# Patient Record
Sex: Female | Born: 1955 | Race: White | Hispanic: No | Smoking: Never smoker
Health system: Southern US, Community
[De-identification: ages and names within clinical notes are randomized; demographics above are authoritative.]

## PROBLEM LIST (undated history)

## (undated) DIAGNOSIS — R569 Unspecified convulsions: Secondary | ICD-10-CM

## (undated) DIAGNOSIS — I639 Cerebral infarction, unspecified: Secondary | ICD-10-CM

## (undated) DIAGNOSIS — K5790 Diverticulosis of intestine, part unspecified, without perforation or abscess without bleeding: Secondary | ICD-10-CM

## (undated) DIAGNOSIS — G4730 Sleep apnea, unspecified: Secondary | ICD-10-CM

## (undated) DIAGNOSIS — I10 Essential (primary) hypertension: Secondary | ICD-10-CM

## (undated) DIAGNOSIS — G459 Transient cerebral ischemic attack, unspecified: Secondary | ICD-10-CM

## (undated) DIAGNOSIS — K219 Gastro-esophageal reflux disease without esophagitis: Secondary | ICD-10-CM

## (undated) DIAGNOSIS — E039 Hypothyroidism, unspecified: Secondary | ICD-10-CM

## (undated) DIAGNOSIS — E079 Disorder of thyroid, unspecified: Secondary | ICD-10-CM

## (undated) DIAGNOSIS — C4490 Unspecified malignant neoplasm of skin, unspecified: Secondary | ICD-10-CM

## (undated) DIAGNOSIS — F32A Depression, unspecified: Secondary | ICD-10-CM

## (undated) DIAGNOSIS — K760 Fatty (change of) liver, not elsewhere classified: Secondary | ICD-10-CM

## (undated) DIAGNOSIS — F329 Major depressive disorder, single episode, unspecified: Secondary | ICD-10-CM

## (undated) DIAGNOSIS — L659 Nonscarring hair loss, unspecified: Secondary | ICD-10-CM

## (undated) DIAGNOSIS — K589 Irritable bowel syndrome without diarrhea: Secondary | ICD-10-CM

## (undated) DIAGNOSIS — M1990 Unspecified osteoarthritis, unspecified site: Secondary | ICD-10-CM

## (undated) DIAGNOSIS — E119 Type 2 diabetes mellitus without complications: Secondary | ICD-10-CM

## (undated) DIAGNOSIS — K5792 Diverticulitis of intestine, part unspecified, without perforation or abscess without bleeding: Secondary | ICD-10-CM

## (undated) DIAGNOSIS — E785 Hyperlipidemia, unspecified: Secondary | ICD-10-CM

## (undated) DIAGNOSIS — F410 Panic disorder [episodic paroxysmal anxiety] without agoraphobia: Secondary | ICD-10-CM

## (undated) DIAGNOSIS — F419 Anxiety disorder, unspecified: Secondary | ICD-10-CM

## (undated) HISTORY — DX: Unspecified osteoarthritis, unspecified site: M19.90

## (undated) HISTORY — DX: Hyperlipidemia, unspecified: E78.5

## (undated) HISTORY — DX: Depression, unspecified: F32.A

## (undated) HISTORY — DX: Hypothyroidism, unspecified: E03.9

## (undated) HISTORY — DX: Sleep apnea, unspecified: G47.30

## (undated) HISTORY — DX: Unspecified malignant neoplasm of skin, unspecified: C44.90

## (undated) HISTORY — DX: Major depressive disorder, single episode, unspecified: F32.9

## (undated) HISTORY — DX: Gastro-esophageal reflux disease without esophagitis: K21.9

## (undated) HISTORY — DX: Anxiety disorder, unspecified: F41.9

## (undated) HISTORY — PX: ABDOMINAL HYSTERECTOMY: SHX81

---

## 1984-11-13 HISTORY — PX: ABDOMINAL HYSTERECTOMY: SUR658

## 2007-11-14 DIAGNOSIS — R569 Unspecified convulsions: Secondary | ICD-10-CM

## 2007-11-14 HISTORY — PX: CHOLECYSTECTOMY: SHX55

## 2007-11-14 HISTORY — DX: Unspecified convulsions: R56.9

## 2009-06-25 ENCOUNTER — Ambulatory Visit: Payer: Self-pay

## 2009-12-31 ENCOUNTER — Inpatient Hospital Stay: Payer: Self-pay | Admitting: Psychiatry

## 2010-11-13 DIAGNOSIS — L659 Nonscarring hair loss, unspecified: Secondary | ICD-10-CM

## 2010-11-13 HISTORY — DX: Nonscarring hair loss, unspecified: L65.9

## 2012-11-13 HISTORY — PX: SQUAMOUS CELL CARCINOMA EXCISION: SHX2433

## 2015-04-21 ENCOUNTER — Encounter: Payer: Self-pay | Admitting: Licensed Clinical Social Worker
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
## 2015-04-21 NOTE — Progress Notes (Signed)
### [facility] LCSW Progress Note:   Suzana Sohail 12/14/2014 2:14 PM Location: [facility] Patient #: [id number] DOB: 06-14-1956 Widowed / Language: Cleophus Molt / Race: White Female    History of Present Illness(Herchel Hopkin N Joshalyn Ancheta; 12/15/2014 11:52 AM) The patient is a 59 year old female who presents for a recheck of Depressive disorders. It is classified as major depression. Symptoms include depressed mood, fatigue and poor concentration, while symptoms do not include loss of interest, crying spells, hypersomnia, headaches or irritability. Onset followed emotional stress, financial problems, family problems and bereavement issues. The patient describes this as moderate in severity and improving. Symptoms are exacerbated by emotional stress and family stressors. Symptoms are relieved by antidepressants, psychotherapy and quiet environment. Associated symptoms include anxiety symptoms and panic symptoms. Suicide risk factors do not include suicidal thoughts, suicidal intention, suicide attempt(s), organized plans or lethal means available.  Additional reason for visit:  Recheck of Anxiety disordersis described as the following: It is classified as generalized anxiety disorder and post traumatic stress disorder. Symptoms include palpitations, trouble breathing, trembling, racing thoughts, excessive worrying, flashbacks and sleep disturbance (Informed LCSW that "I just don't want to get out of the bed. All I want to do is to sleep."), while symptoms do not include fear of dying, fear of losing control or repetitive behaviors. Onset followed family problems and bereavement issues. The patient describes this as moderate in severity and improving. Symptoms are exacerbated by uncomfortable situations, emotional stress and family stress. Symptoms are relieved by anxiolytics, antidepressants, psychotherapy, quiet environment and rest. Associated symptoms include poor  concentration and depression symptoms. Suicide risk factors do not include suicidal thoughts, suicidal intention or suicide attempt(s).  Note: Start Time: 2:14 p.m. End Time: 3:20 p.m.  Aniah returns to [facility] to continue addressing depression and anxiety which was further exacerbated over the past two years with accidental shooting death of her grandson. Client also continues to see Dr. Nicolasa Ducking for medication management. Overall Zaydah's moods have improved and she has experienced a reduction in many of her emotional/behavioral symptoms. Ongoing stressor is "Mother is getting worse with her memory and is falling a lot." Step-father will look into any assistance via [place] since he is a veteran since the two of them have financial assets that disqualify them for any state or local services/programs for in-home care.  "We went to [place] again. We did more. We saw more and went to [facility]." This was over Christmas and to son in [place] family and she did enjoy her time. "Another thing is I retired after 35 years." "I'm having better days than worse days." "I don't have to worry about money and I'm setting some back. My brother is living with me. We get along pretty good." He is going through a separation and is on dialysis. The two of them have joined a gym and although they have not been she will go with him today. Lyrik sees the two as being there to encourage one another and motivate each other to make some lifestyle changes.  Colvin Caroli, is moving in with the father since client's daughter and her husband are separating. "He's moving out now." Her daughter Benjamine Mola is having ups and downs. She recently visited daughter and grand-children and so far their separtion is amicable yet she has some concern about how things will go since the daughter does not work. Son-in-law will finish his [place] in about three years and planning to move back to [place]. "I told her that  I'll be here for her." Ravenna is pleased that daughter has good friends and support network. Recent visit was really a mother-daughter bonding experience and Yvonna shared a few interactions that the two had including daughter giving client one of the deceased grand-son's favorite toys.  Processed ongoing grief reactions and Faven stated that her moments of over-whelming grief are fewer and fewer. "Some days I still cry. I feel like I'm so much stronger than I was. I feel like I use to. I don't feel 100% like I use to be but I'm getting better. I'm just so glad that I could retire and get my pension."  She has reconnected with a friend who's husband recently died. Azaliah has enjoyed giving friends/family members gifts over the [place]. "I'm working on getting my diabetes under control."  She and LCSW reviewed treatment plan and client felt that the goals were still the same and she was able to recognize the progress that she has made as well as the areas still to work on improvement. Day signed treatment plan and since she is doing much better the two of [place] agreed for a return appointment in April 2016.   Supportive therapy with insight was provided and encouraged client to schedule additional appointments with LCSW PRN. No additional concerns or needs assessed or voiced during session today.    Social History(Sonni Barse N Keysi Oelkers; 12/14/2014 2:55 PM) Marital status. Widowed 01/14/09: Kerry Dory is her third husband 10/10, lived together for five and married nearly 71; 1st married in 1976, had daughter in 40, second husband was an alcoholic; No Drug Use. Has a history of cocaine and cannabis abuse. Full remission. Employment. 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 She has been on disability through employer since May 2009: Since 1979 was with [employer], now [employer] for years. Living Situation. Lives with relatives. Younger  brother who is ill and on dialysis just moved in with client about two months ago. Drug Use Number of children. Daughter is Benjamine Mola, now 104; step-daughter & step-son were ages 4 & 70 when she married husband who was widowed but he was a single father Additional comments. has two older brothers & one younger brother; daughter had a son in 2009, Memorial Day; 67 year old grand-daughter, 25 year old grand-son & 7 mos. old twins, boy & girl, all daughters & in [place] Prior heavy alcohol use (>= 4 drinks/day). Occasional alcohol use. History of alcoholism per client and previous psychiatric assessment. Tobacco use. Current every day smoker.    Medication History(Laurian Edrington N Jaiden Wahab; 12/14/2014 2:22 PM) ClonazePAM (0.5MG  Tablet, 1 Oral as needed) Active. TraZODone HCl (100MG  Tablet, 2 Oral QHS) Active. (Added a few months ago by Dr. Nicolasa Ducking.) HydrOXYzine HCl (50MG  Tablet, 1 tab Oral TID PRN) Active. (Prescribed by client's Psychiatrist, Dr. Cherene Altes for the treatment of anxiety.) Zoloft (100MG  Tablet, 2 tabs Oral QD) Active. (Prescribed by client's Psychiatrist, Dr. Cherene Altes.) Abilify (5MG  Tablet, 1 Oral QD) Active. (Prescribed by client's Psychiatrist, Dr. Cherene Altes.)    Review of Systems(Shalimar Mcclain N Maysie Parkhill; 12/14/2014 3:05 PM) Psychiatric:Present- Anxiety ("I'm fine."), Decrease concentration (Cannot read books even at this point since she is having such a difficult time concentrating.), Depression ("I'm trying to stay busy." "I'm not as depressed."), Hypersomnia (Napping during the day when she does not sleep well at night.) and Trouble Falling Asleep (Episodically experiences this. Has sleep aides available and also tries watching television until falls asleep.  LCSW discussed sleep hygiene and benefits of this.). Not Present- Change in Sleep Pattern ("I'm not having bad dreams anymore." Still on occasion will have odd dreams about work related issues. Some improvement noted with 200 mg  Trazadone. Uses 1/2 mg Klonopin at night time to slow racing thoughts.), Frequent crying, Inability to Concentrate, Nervousness ("I've improved a lot."), Panic Attacks (These are better yet still has bouts of restlessness but taking another anti-anxiety medication added by Dr. Nicolasa Ducking. Overall reports a decrease in number of panic attacks. Could not recall last but quesses about twice per month.), Suicidal Ideation and Suicidal Planning.    Assessment & Plan(Niklaus Mamaril N Erla Bacchi; 12/15/2014 11:00 AM) MDD (major depressive disorder), recurrent episode, moderate (296.32  F33.1) Current Plans l Short Term Goal: Patient will Develop Appropriate Coping Skills  l Interventions: 1. Encourage expression of feelings, concerns and needs and offered psychoeducation/therapy with insight around recognizing ways of thinking, interpreting and unhealthy patterns of behaviors. 2. Explored alternative responses for dealing effectively with stress-inducing situations and discussed cognitive reframing using client's own examples. 3. Encouraged client to focus on personal progress, strengths and accomplishments. 4. Reframed failures or negative experiences as normal part of the learning process and human condition. 5. Assisted client to identify their own healthy goals and useful skill set and increase awareness of negative self talk. 6. Supportive psychotherapy with insight, highlighted Madelon's emotional growth and personal awareness and use of healthier coping skills.   - C - POSTTRAUMATIC STRESS DIS (309.81  F43.10) Impression: Appears stable and controlled with medication and self care. This was not a topic of today's session. Current Plans l Short Term Goal: Identify Triggers for Symptoms  l Interventions: 1. Explore cognitive messages that mediate anxiety response and retrain in adaptive cognitions. Encouraged to find ways to use previously provided handout on increasing positive self  talk. 2. Reinforce insights into past emotional issues and present anxiety. (i.e., trauma and trust issues, fears). 3. Highligted and reinforced client's development of more reality-based cognitive messages that appear to increase self-confidence in coping with irrational fears and intense negative emotions. 4. Educate to importance of developing assertive communication skills to be able to say "no" and avoid opportunities to over-extend self. 5. Congratulated client on practicing self-nurturing behaviors such as getting a pedicure. 6. Reinforced the value of surrounding self with positive people and activities, having more daily structure and things to look forward to doing and emphasized use of previous handouts on stress management and "letting go."   Bereavement care (V62.82  Z63.4) Current Plans l Short Term Goal: Patient will be Able to Communicate Needs or Concerns  l Interventions: 1. Continue to demonstrate care and empathy and validated her bereavement experience. 2. Ongoing education on stages of grief, and normalize appropriate feelings such as anger, guilt, depression, longing for and development of spiritual conflict. 3. Encourage and normalize all feelings expressed and explore availabe supports and resources for her as she continues to grieve and as it comes upon anniversaries of grandson's death, holidays, his birthday, etc. 4. Process the impact of multiple losses on both grief, coping, life perspectives and depressive symptoms.  l Level of Participation: Interactive  l Patient Strength: Verbal  l Patient Strength: Managing Daily Responsibilities  l Patient Strength: Cultural/Spiritual and Community Support/Involvement  l Patient Strength: Family involvement or Support  l Patient Strength: Stable Housing  l Patient Strength: Aware of Need for Medications  l INDIVIDUAL PSYCHOTHERAPY FOR 45 TO 50 MINUTES  (96222) l Follow up as needed  Signed electronically by Marian Sorrow Jowana Thumma (12/15/2014 11:52 AM)

## 2015-04-21 NOTE — Progress Notes (Signed)
### ALLSCRIPTS PRO LCSW Progress Note:   Taylor Thompson 09/30/2014 11:22 AM Location: [facility] Patient #: [id number] DOB: October 23, 1956 Widowed / Language: Taylor Thompson / Race: White Female    History of Present Illness(Taylor Thompson N Taylor Thompson; 09/30/2014 1:07 PM) The patient is a 59 year old female who presents for a recheck of Anxiety disorders. It is classified as generalized anxiety disorder and post traumatic stress disorder. Symptoms include palpitations, trouble breathing, trembling, racing thoughts, excessive worrying, flashbacks and sleep disturbance (Informed LCSW that "I just don't want to get out of the bed. All I want to do is to sleep."), while symptoms do not include fear of dying, fear of losing control or repetitive behaviors. Onset followed family problems and bereavement issues. The patient describes this as moderate in severity and improving. Symptoms are exacerbated by uncomfortable situations, emotional stress and family stress. Symptoms are relieved by anxiolytics, antidepressants, psychotherapy, quiet environment and rest. Associated symptoms include poor concentration and depression symptoms. Suicide risk factors do not include suicidal thoughts, suicidal intention or suicide attempt(s).  Additional reasons for visit:  Recheck of Depressive disordersis described as the following: It is classified as major depression. Symptoms include depressed mood, fatigue and poor concentration, while symptoms do not include loss of interest, crying spells, hypersomnia, headaches or irritability. Onset followed emotional stress, financial problems, family problems and bereavement issues. The patient describes this as moderate in severity and improving. Symptoms are exacerbated by emotional stress and family stressors. Symptoms are relieved by antidepressants, psychotherapy and quiet environment. Associated symptoms include anxiety symptoms and panic symptoms.  Suicide risk factors do not include suicidal thoughts, suicidal intention, suicide attempt(s), organized plans or lethal means available.  Recheck of Adjustment disordersis described as the following: This adjustment disorder is acute. Symptoms include depressed mood, palpitations, anxiety and jitteriness, while symptoms do not include tearfulness or agitated mood. The patient describes this as moderate in severity and improving. Symptoms are exacerbated by emotional stress and fatigue. Symptoms are relieved by psychotherapy, support groups and talking with friends. Note for "Adjustment disorders": 09/28/14, made the second anniversary of grandson's death. Grief reaction is stabilizing.  Note: Taylor Thompson returns to session to continue addressing symptoms of anxiety and depression which she experiences as feeling easily over-whelmed, disinterested in doing things, easily frustrated and maintains various distorted thoughts which appear to perpetuate her down moods and anxiety. "I've always been sensitive to criticism." Feeling somewhat overwhelmed about what to do with lump sum money from her former employer yet she has a good plan of how to handle the money to make it available for her for years to come. Anxiety around how much taxes she fears she will have to pay on the money and LCSW suggested she speak with financial advisor and/or account before jumping to conclusions and keeping self anxious about this. Taylor Thompson voiced agreement.  Recent concern because daughter had two miscarriages and then had serious medical complications afterwards. Client spent time with daughter, son in law and three grand-children recently following daughter's miscarriage. She was a help around the house to family and felt proud of what she helped with yet voiced sadness over feeling that daughter was critical of her. We discussed the need to stay aware of family dynamics, boundaries and how she internalizes what others think and say  and to be cautious as to not criticize herself and fall into self doubt.  Processed overall her progress in terms of moods, symptoms and behaviors. "I struggle every day but I've come a  long way."  LCSW reinforced client's positive character traits and successes through life and management of day to day tasks. She continues to find the therapeutic experience as helpful citing "I'm not suicidal, that does not enter my mind anymore. I don't feel that way anymore." Her primary goal is to continue to have someone to talk with and build upon present coping skills and interventions while also learning more about how to handle this internal critic and gain a healthier view of herself and her self worth.  Overall, client is not at current risk of harm to others or self. Reinforced importance of self help around understanding depressive symptoms and impact of these on inter & intrapersonal interactions and conflicts.    Problem List/Past Medical(Taylor Thompson N Taylor Thompson; 09/30/2014 11:28 AM) MDD (major depressive disorder), recurrent episode, moderate (296.32  F33.1) Bereavement care (V62.82  Taylor Thompson)    Social History(Taylor Thompson N Taylor Thompson; 09/30/2014 11:35 AM) Employment. 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 She has been on disability through employer since May 2009: Since 1979 was with [employer], now [employer] for years. Living Situation. Lives with relatives. Younger brother who is ill and on dialysis just moved in with client about two months ago.    Medication History(Taylor Thompson N Taylor Thompson; 09/30/2014 12:10 PM) TraZODone HCl (100MG  Tablet, 2 Oral QHS) Active. (Added a few months ago by Dr. Nicolasa Ducking.) ClonazePAM (0.5MG  Tablet, 1 Oral BID) Active. (Recent decrease in this medication. Will talk to Dr. Nicolasa Ducking as she is not satisfied with this decision.) HydrOXYzine HCl (50MG  Tablet, 1 tab Oral TID PRN) Active. (Prescribed by client's  Psychiatrist, Dr. Cherene Altes for the treatment of anxiety.) Zoloft (100MG  Tablet, 2 tabs Oral QD) Active. (Prescribed by client's Psychiatrist, Dr. Cherene Altes.) Abilify (5MG  Tablet, 1 Oral QD) Active. (Prescribed by client's Psychiatrist, Dr. Cherene Altes.)    Review of Systems(Jarell Mcewen N Lyonel Morejon; 09/30/2014 12:15 PM) Psychiatric:Present- Anxiety ("Yesterday I had a really bad day." Reports an increase in anxiety.), Decrease concentration (Cannot read books even at this point since she is having such a difficult time concentrating.), Depression (Continues to feel and report improvement. "I have my bad days." Denied that these were as frequent as they have been.), Hypersomnia (Napping during the day when she does not sleep well at night.) and Trouble Falling Asleep (Episodically experiences this. Has sleep aides available and also tries watching television until falls asleep. LCSW discussed sleep hygiene and benefits of this.). Not Present- Change in Sleep Pattern (Some improvement noted with 200 mg Trazadone. Uses 1/2 mg Klonopin at night time to slow racing thoughts.), Frequent crying, Inability to Concentrate, Nervousness ("I've improved a lot."), Panic Attacks (These are better yet she is still having panicky symptoms.), Suicidal Ideation and Suicidal Planning.    Assessment & Plan(Madi Bonfiglio N Jedrek Dinovo; 09/30/2014 1:05 PM) Major depressive disorder, recurrent episode, moderate (296.32  F33.1) Current Plans l Short Term Goal: Patient will Develop Appropriate Coping Skills  l Interventions: 1. Encourage expression of feelings, concerns and needs and offered psychoeducation/therapy with insight around recognizing ways of thinking, interpreting and unhealthy patterns of behaviors. 2. Explored alternative responses for dealing effectively with stress-inducing situations and discussed cognitive reframing using client's own examples. 3. Encouraged client to focus on personal progress,  strengths and accomplishments. 4. Reframed failures or negative experiences as normal part of the learning process and human condition. 5. Assisted client to identify their own healthy goals and useful skill set and increase awareness of  negative self talk. 6. Supportive psychotherapy with insight, highlighted Sherriann's emotional growth and personal awareness and use of healthier coping skills.   Bereavement care (V62.82  Z63.4) Current Plans l Short Term Goal: Patient will be Able to Communicate Needs or Concerns  l Interventions: 1. Continue to demonstrate care and empathy and validated her bereavement experience. 2. Ongoing education on stages of grief, and normalize appropriate feelings such as anger, guilt, depression, longing for and development of spiritual conflict. 3. Encourage and normalize all feelings expressed and explore availabe supports and resources for her as she continues to grieve and as it comes upon anniversaries of grandson's death, holidays, his birthday, etc. 4. Process the impact of multiple losses on both grief, coping, life perspectives and depressive symptoms.  l Level of Participation: Interactive  l Patient Strength: Verbal  l Patient Strength: Managing Daily Responsibilities  l Patient Strength: Cultural/Spiritual and Community Support/Involvement  l Patient Strength: Family involvement or Support  l Patient Strength: Stable Housing  l Patient Strength: Aware of Need for Medications  l Follow up as needed  l INDIVIDUAL PSYCHOTHERAPY FOR 45 TO 50 MINUTES ([id number])   Signed electronically by Marian Sorrow Bakari Nikolai (09/30/2014 1:08 PM)

## 2015-04-21 NOTE — Progress Notes (Signed)
### ALLSCRIPTS PRO LCSW Progress Note:    Taylor Thompson 08/11/2013 1:24 PM Location: [facility] Patient #: [id number] DOB: [birth year] Widowed / Language: Cleophus Molt / Race: White Female    History of Present Illness(Lurline Caver N Lateia Fraser; 08/18/2013 10:02 AM) The patient is a 59 year old female who presents with a depressive disorder. It is classified as major depression. Symptoms include loss of interest, depressed mood, crying spells, fatigue, poor concentration, hypersomnia, headaches and irritability. Onset followed emotional stress, financial problems, family problems and bereavement issues. The patient describes this as moderate in severity and improving. Symptoms are exacerbated by emotional stress and family stressors. Symptoms are relieved by antidepressants, psychotherapy and quiet environment. Associated symptoms include anxiety symptoms and panic symptoms. Suicide risk factors do not include suicidal thoughts, suicidal intention, suicide attempt(s), organized plans or lethal means available. Note for "Depressive disorders": "I haven't felt well for the past few weeks." Client has had trouble with her blood sugars which have been in the 500s & an A1C was at 13, very high.  "I'm good. The only thing that isn't good is my blood sugar. It's been running high." This has been worse over last two weeks. Her daughter & twin grand-daughter/grand-son have been visting with her and she is enjoying their stay. Although she still has days where she does not feel as motivated to do things she is keeping up with ADLs and household chores and usually feels better after completing these. She recently enjoyed time alone getting a pedicure.  Additional reasons for visit:  Anxiety disordersis described as the following: It is classified as post traumatic stress disorder. Symptoms include palpitations, trouble breathing, trembling, racing thoughts, excessive worrying,  flashbacks and sleep disturbance (Informed LCSW that "I just don't want to get out of the bed. All I want to do is to sleep."), while symptoms do not include fear of dying, fear of losing control or repetitive behaviors. Onset followed family problems and bereavement issues. The patient describes this as moderate in severity and improving. Symptoms are exacerbated by uncomfortable situations, emotional stress and family stress. Symptoms are relieved by anxiolytics, antidepressants, psychotherapy, quiet environment and rest. Associated symptoms include poor concentration and depression symptoms. Suicide risk factors do not include suicidal thoughts, suicidal intention or suicide attempt(s). Note for "Anxiety disorders": "It's getting better." Still worries often especially about her mother & stepfather and their age & increasing health problems & needs. Joyceline has some anxiety about own health problems and is motivated to continue to use coping strategies to manage stress as this has a direct impact on her DM. She has done a bit more exercise when weather permits and finds relief in the breathing techniques.  Adjustment disordersis described as the following: This adjustment disorder is acute. Symptoms include depressed mood, tearfulness, palpitations, anxiety, agitated mood and jitteriness. The patient describes this as moderate in severity and improving. Symptoms are exacerbated by emotional stress and fatigue. Symptoms are relieved by psychotherapy, support groups and talking with friends. Note for "Adjustment disorders": Harli has had a few good days while other days she talked about how she just "falls apart" crying, isolating and not wanting to get out of her bed. She informed LCSW "I'm mad. I feel like my son-in-law wasn't thinking straight." "I'm mad." She has found some comfort in reading books on grief, daily meditations and has plans to attend another grief support group later in the year,  especially the meeting on death of a child because she was unable  to go to that meeting.  LCSW explored with Meosha those thoughts and feelings that although are normal and to be expected as part of the grieving process, when left unresolved or not discussed, has the potential to complicate mood and quality of life. Reassured that anger is a normal stage of the grieving process and we discussed several reasons for this and the role of cognitive reframing when one feels stuck on a specific thought/thoughts and cannot move forward with their healing.  Urged ongoing venting of all feelings associated both with grandson's death & her grief. Mickey was appreciative and voiced understanding of the role of anger in healing grief.  Note: "I'm doing better." Mother has been hospitalized & admitted to nursing home again but now at home. Mother's mood flares up more and a brother, 47, is now in a nursing home due to Pneumonia and ESRD. Revive is part of [facility] & also with Grief Sharing are classes that she has attended more regularly held at a Church in [place].  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 She will have surgery but not until 10/16/13, with a delay because of this doctor's popularity. Mohs surgery is what she is having until the margins are cleared.  She & daughter are getting along well and both talk some about death of Killian's, Leonilda's grandson. She feels less resentment towards son in law since it was his gun that grandson had the accident with. Sherrice is remembering grand-son more and has pictures out & other memorabile to keep her grandson close to her.  Feeling less worried about family, mother's failing health and although she has anxiety, she is feeling less like a "worry wart" then she has in the past. Annalucia is still "having difficulty handling crowds" and is able to recognize that sometimes anxiety is due to skipping  medication. Overall, there is less dread and worry per Judson Roch.  Had a nice weekend in the [place] recently with her cousin and will return again in a few weeks. Feeling better about saying no to daughter & other family that ask for help. More focus on herself and shared having an "Ah Lamonte Sakai" moment in terms of accounting to others or being responsible to others and stating that "I have control. I don't have to answer to others."    Social History(Chere Babson N Arlina Sabina; 08/18/2013 10:03 AM) Living Situation. Lives alone.    Review of Systems(Naima Veldhuizen N Cate Oravec; 08/11/2013 2:23 PM) Psychiatric:Present- Anxiety, Change in Sleep Pattern (Somewhat better with regular use of CPAP machine and taking Klonopin. No longer having regular nightmares around loss of her marriage, her house and her job.), Depression (Better and feels happier. Doing more, getting out of the house.), Nervousness and Trouble Falling Asleep (Episodically experiences this. Has sleep aides available and also tries watching television until falls asleep. LCSW discussed sleep hygiene and benefits of this.). Not Present- Frequent crying (Reported today that she is having fewer crying spells. These seem more associated with grief.), Hypersomnia (She still endorses taking naps some days and we were able to talk about other contributing factors such as her not using her CPAP machine at night and lack of a bed time ritual/routine.), Inability to Concentrate, Panic Attacks, Suicidal Ideation and Suicidal Planning.    Assessment & Plan(Beldon Nowling N Geniva Lohnes; 08/18/2013 10:07 AM) Recurrent major depression-severe (296.33) Current Plans l Short Term Goal: Patient will Develop Appropriate Coping Skills  l Interventions: 1. Encourage ongoing expression of  feelings, concerns and needs. 2. Commended client on use of alternative responses for dealing effectively with stress-inducing situations such as walking, reading, visiting  friends/family and taking trips. 3. Reinforced Meshawn's personal progress, strengths and accomplishments, both historically and throughout her journey with emotional/behavioral health issues. 4. Supportive psychotherapy with insight, psychoeducation on causes/triggers for depression and mood changes and commended client on increasing self-awareness and comfort level with talking about & processing difficult, negative emotions.   Bereavement care (V62.82) Current Plans l Short Term Goal: Patient will be Able to Communicate Needs or Concerns  l Interventions: 1. Continue to demonstrate care and empathy and validated her bereavement experience. 2. Ongoing education on stages of grief, and normalize appropriate feelings such as anger, guilt, depression, longing for and development of spiritual conflict. 3. Encourage and normalize all feelings expressed and explore availabe supports and resources for her as she continues to grieve and as it comes upon anniversaries of grandson's death, holidays, his birthday, etc. 4. Process the impact of multiple losses on both grief, coping, life perspectives and depressive symptoms.  l Level of Participation: Interactive  l Patient Strength: Verbal  l Patient Strength: Managing Daily Responsibilities  l Patient Strength: Cultural/Spiritual and Community Support/Involvement  l Patient Strength: Family involvement or Support  l Patient Strength: Stable Housing  l Patient Strength: Aware of Need for Medications  l INDIVIDUAL PSYCHOTHERAPY FOR 45 TO 50 MINUTES (44920) l Follow up in 3 weeks or as needed    Signed electronically by Blaire Hodsdon N Cassiel Fernandez (08/18/2013 10:08 AM)

## 2015-04-22 ENCOUNTER — Ambulatory Visit (INDEPENDENT_AMBULATORY_CARE_PROVIDER_SITE_OTHER): Payer: Medicare Other | Admitting: Licensed Clinical Social Worker

## 2015-04-22 ENCOUNTER — Other Ambulatory Visit: Payer: Self-pay

## 2015-04-22 DIAGNOSIS — F331 Major depressive disorder, recurrent, moderate: Secondary | ICD-10-CM | POA: Diagnosis not present

## 2015-04-22 DIAGNOSIS — F4310 Post-traumatic stress disorder, unspecified: Secondary | ICD-10-CM

## 2015-04-22 NOTE — Progress Notes (Signed)
THERAPIST PROGRESS NOTE  Session Time:  3:20 p.m. - 4:10 p.m.  Participation Level: Active  Behavioral Response: CasualAlertAnxious  Type of Therapy: Individual Therapy  Treatment Goals addressed: Anxiety and Coping  Interventions: CBT, Solution Focused, Supportive and Reframing  Summary: Taylor Thompson is a 59 y.o. female who presents with .  Taylor Thompson has not been to OPT for several months and stated "I'm having a good day. I've been feeling like getting up and doing things."  Taylor Thompson's mother is now on hospice services.  Her daughter lived with her and her brother for 3 weeks due to grand-mother's illness and the uncertainties about whether she would live or die.  Living arrangements with her brother are good.  "I bought me a new car." Other than sadness regarding mother's failing health and her concerns over how her daughter is coping as a single mother with three children and the grief of son's death, now approaching two years ago, Taylor Thompson reported "I'm hanging in there."  Appropriate sadness around mother's poor health stating "I've just accepted where she is."  Another stressor is that her oldest brother now has neck cancer and with multiple surgeries then followed by a heart attack and ended up having stents. In addition, a nephew had a near death experience and is in a nursing rehab program.  Discussed ongoing concerns about her daughter and grand-children.  Taylor Thompson just returned today from visiting family in [place].  Update is that the son in law would like for client's daughter and he to work things out.  Appropriate concern voiced that the family never saw a counselor to deal with/talk about the shooting death of their son.  Client's oldest grand-daughter just graduated 5th grade and she was present for that.  This grand-child originally moved to live with her father yet has been spending more time in the home with Taylor Thompson's daughter and other grand-children.  "I don't know what to  think."  Some set back in depression per client as evidenced by not exercising like she was and cites that her energy level has been down and having some days where it is hard to get out of bed. Continued use of her c-pap machine has made a noticeable difference per client in her quality of sleep and daily energy level.   "I don't know how I'm doing it. I'm afraid, I don't want to go back to where I was."   Taylor Thompson reflected on where she was when she first came into therapy with LCSW and expressed "I was a mess."  At this time she cited feeling confident that she is stable and has the knowledge, resources and coping skills to handle any set backs that she has had.  Taylor Thompson is comfortable returning to OPT on an as needed basis.  She voiced much appreciation to LCSW for support, guidance and recommendations.  Suicidal/Homicidal: Nowithout intent/plan  Therapist Response:    Offered ongoing emotional and social support and reinforced client's available strengths and resiliency factors.  Reminded that client has indeed made progress and has used the strategies discussed on a regular basis.  Empowered her to not allow      thinking errors to convince her that she will have a relapse and highlighted what all she has been through and even so, that she pulled through and did not require an inpatient hospitalization.  Reinforced with her the importance of having ongoing stress     management/relaxation activities and other things to keep herself busy and that  can distract her before thoughts become too over-whelming. Gently reminded to continue strategies that will allow her to recognize any patterns in thoughts or in situations that     precipitate a change in mood.  Reinforced setting of healthy boundaries and commended her on recent situations where she did this and the positive results for her self care.  Encouraged calls to clinic PRN and to return to OPT should she see self sliding     back with symptoms.  Plan:  Return again PRN.  Taylor Thompson will continue to take prescription medications as prescribed and follow up with Psychiatrist, Dr. Nicolasa Ducking regarding medication refills/management.  She will continue to reach out to her support network and support groups as needed and access additional services/treatment as needed.  Diagnosis: Major Depressive Disorder, Recurrent, Moderate, In Early Remission   PTSD    [name], LCSW 04/22/2015

## 2015-05-20 ENCOUNTER — Ambulatory Visit (INDEPENDENT_AMBULATORY_CARE_PROVIDER_SITE_OTHER): Payer: Medicare Other | Admitting: Licensed Clinical Social Worker

## 2015-05-20 DIAGNOSIS — F332 Major depressive disorder, recurrent severe without psychotic features: Secondary | ICD-10-CM

## 2015-05-20 DIAGNOSIS — F4310 Post-traumatic stress disorder, unspecified: Secondary | ICD-10-CM

## 2015-05-20 DIAGNOSIS — F4321 Adjustment disorder with depressed mood: Secondary | ICD-10-CM | POA: Diagnosis not present
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
## 2015-06-03 ENCOUNTER — Ambulatory Visit: Payer: BLUE CROSS/BLUE SHIELD | Admitting: Licensed Clinical Social Worker

## 2015-06-17 ENCOUNTER — Ambulatory Visit
Admission: EM | Admit: 2015-06-17 | Discharge: 2015-06-17 | Disposition: A | Discharge status: Home / Self Care | Payer: Medicare Other | Attending: Internal Medicine | Admitting: Internal Medicine

## 2015-06-17 DIAGNOSIS — K120 Recurrent oral aphthae: Secondary | ICD-10-CM

## 2015-06-17 MED ORDER — FAMCICLOVIR 250 MG PO TABS: 250.0000 mg | ORAL_TABLET | Freq: Two times a day (BID) | ORAL | Status: DC

## 2015-06-17 MED ORDER — TRIAMCINOLONE ACETONIDE 0.1 % MT PSTE: 1.0000 "application " | PASTE | Freq: Two times a day (BID) | OROMUCOSAL | Status: DC

## 2015-06-17 MED ORDER — HYDROCODONE-ACETAMINOPHEN 5-325 MG PO TABS: 1.0000 | ORAL_TABLET | Freq: Four times a day (QID) | ORAL | Status: DC | PRN

## 2015-06-17 MED ORDER — FAMCICLOVIR 500 MG PO TABS
ORAL_TABLET | ORAL | Status: DC
Start: 2015-06-17 — End: 2015-06-17

## 2015-06-17 NOTE — ED Notes (Signed)
Patient states that this occurred the first time in April and she went to her dentist and was sent to a specialist in which they obtained a biopsy. She states that this has happened again and it is worse. She states that hurts to eat. She states that she can only eat soft foods.

## 2015-06-17 NOTE — ED Provider Notes (Signed)
CSN: [id number]     Arrival date & time 06/17/15  1513 History   First MD Initiated Contact with Patient 06/17/15 1619     Chief Complaint  Patient presents with  . Mouth Lesions   (Consider location/radiation/quality/duration/timing/severity/associated sxs/prior Treatment) HPI   This a 59 year old female who presents with mouth sores mostly over her upper lip on the buccal surface extending posteriorly. It does not affect the tongue or cheeks at this time. She had a similar incident in April he was seen by her dentist who then referred her to a specialist to obtain a biopsy he doesn't know the results. He did give the results to her primary care and I researched the [facility] site but nothing is there listed. In April she used Magic mouthwash which seemed to alleviate her problem. She does state that she recently has been under a lot of stress with her mother dying in July and also the anniversary of her grandson who had committed suicide  unintentionally at age 32 he found his father's gun and shot himself. The lesions are very painful and she is at the present time only eating very soft foods. She is taking Famvir for genital herpes on a prophylactic basis.  No past medical history on file. Past Surgical History  Procedure Laterality Date  . Cholecystectomy  2009  . Squamous cell carcinoma excision  2014  . Abdominal hysterectomy     No family history on file. History  Substance Use Topics  . Smoking status: Never Smoker   . Smokeless tobacco: Not on file  . Alcohol Use: No   OB History    No data available     Review of Systems  HENT: Positive for mouth sores.   All other systems reviewed and are negative.   Allergies  Codeine and Sulfa antibiotics  Home Medications   Prior to Admission medications   Medication Sig Start Date End Date Taking? Authorizing Provider  ARIPiprazole (ABILIFY) 5 MG tablet  03/21/15  Yes Historical Provider, MD  atorvastatin (LIPITOR) 40 MG tablet   03/21/15  Yes Historical Provider, MD  clonazePAM (KLONOPIN) 0.5 MG tablet  04/18/15  Yes Historical Provider, MD  escitalopram (LEXAPRO) 10 MG tablet  04/18/15  Yes Historical Provider, MD  ESTRACE VAGINAL 0.1 MG/GM vaginal cream  03/08/15  Yes Historical Provider, MD  HUMALOG KWIKPEN 100 UNIT/ML KiwkPen  03/30/15  Yes Historical Provider, MD  hydrOXYzine (ATARAX/VISTARIL) 50 MG tablet  04/14/15  Yes Historical Provider, MD  ibuprofen (ADVIL,MOTRIN) 800 MG tablet  02/17/15  Yes Historical Provider, MD  LANTUS SOLOSTAR 100 UNIT/ML Solostar Pen  02/17/15  Yes Historical Provider, MD  metformin (FORTAMET) 1000 MG (OSM) 24 hr tablet  02/21/15  Yes Historical Provider, MD  methocarbamol (ROBAXIN) 500 MG tablet  02/17/15  Yes Historical Provider, MD  sertraline (ZOLOFT) 100 MG tablet  03/21/15  Yes Historical Provider, MD  traZODone (DESYREL) 100 MG tablet  03/30/15  Yes Historical Provider, MD  famciclovir (FAMVIR) 250 MG tablet Take 1 tablet (250 mg total) by mouth 2 (two) times daily. 06/17/15   Lorin Picket, PA-C  HYDROcodone-acetaminophen (NORCO/VICODIN) 5-325 MG per tablet Take 1-2 tablets by mouth every 6 (six) hours as needed for severe pain. 06/17/15   Lorin Picket, PA-C  triamcinolone (KENALOG) 0.1 % paste Use as directed 1 application in the mouth or throat 2 (two) times daily. 06/17/15   Lorin Picket, PA-C   BP 116/79 mmHg  Pulse 95  Temp(Src) 98.3  F (36.8 C) (Tympanic)  Resp 16  Ht 5' (1.524 m)  Wt 197 lb (89.359 kg)  BMI 38.47 kg/m2  SpO2 97% Physical Exam  Constitutional: She is oriented to person, place, and time. She appears well-developed and well-nourished.  HENT:  Head: Normocephalic and atraumatic.  Admission of her mouth shows large amount of ulcerations on the buccal surface with a white surface on an erythematous base. There are now extending posteriorly along the lip to about the level of the corner of her mouth bilaterally. There is no lesions present on her cheeks or on her  tongue or gums. Is no cervical adenopathy.  Neurological: She is alert and oriented to person, place, and time.  Skin: Skin is warm and dry.  refer to HEENT for details  Psychiatric: She has a normal mood and affect. Her behavior is normal. Judgment and thought content normal.  Nursing note and vitals reviewed.   ED Course  Procedures (including critical care time) Labs Review Labs Reviewed - No data to display  Imaging Review No results found.   MDM   1. Aphthous ulcer of mouth    New Prescriptions   FAMCICLOVIR (FAMVIR) 250 MG TABLET    Take 1 tablet (250 mg total) by mouth 2 (two) times daily.   HYDROCODONE-ACETAMINOPHEN (NORCO/VICODIN) 5-325 MG PER TABLET    Take 1-2 tablets by mouth every 6 (six) hours as needed for severe pain.   TRIAMCINOLONE (KENALOG) 0.1 % PASTE    Use as directed 1 application in the mouth or throat 2 (two) times daily.  Plan: 1. Diagnosis reviewed with patient 2. rx as per orders; risks, benefits, potential side effects reviewed with patient 3. Recommend supportive treatment with Vitamin B12 1000 mcg daily 4. F/u prn if symptoms worsen or don't improve     Lorin Picket, PA-C 06/17/15 1711

## 2015-06-17 NOTE — Discharge Instructions (Signed)
Oral Ulcers Oral ulcers are painful, shallow sores around the lining of the mouth. They can affect the gums, the inside of the lips, and the cheeks. (Sores on the outside of the lips and on the face are different.) They typically first occur in school-aged children and teenagers. Oral ulcers may also be called canker sores or cold sores. CAUSES  Canker sores and cold sores can be caused by many factors including:  Infection.  Injury.  Sun exposure.  Medications.  Emotional stress.  Food allergies.  Vitamin deficiencies.  Toothpastes containing sodium lauryl sulfate. The herpes virus can be the cause of mouth ulcers. The first infection can be severe and cause 10 or more ulcers on the gums, tongue, and lips with fever and difficulty in swallowing. This infection usually occurs between the ages of 1 and 3 years.  SYMPTOMS  The typical sore is about  inch (6 mm) in size and is an oval or round ulcer with red borders. DIAGNOSIS  Your caregiver can diagnose simple oral ulcers by examination. Additional testing is usually not required.  TREATMENT  Treatment is aimed at pain relief. Generally, oral ulcers resolve by themselves within 1 to 2 weeks without medication and are not contagious unless caused by herpes (and other viruses). Antibiotics are not effective with mouth sores. Avoid direct contact with others until the ulcer is completely healed. See your caregiver for follow-up care as recommended. Also:  Offer a soft diet.  Encourage plenty of fluids to prevent dehydration. Popsicles and milk shakes can be helpful.  Avoid acidic and salty foods and drinks such as orange juice.  Infants and young children will often refuse to drink because of pain. Using a teaspoon, cup, or syringe to give small amounts of fluids frequently can help prevent dehydration.  Cold compresses on the face may help reduce pain.  Pain medication can help control soreness.  A solution of diphenhydramine  mixed with a liquid antacid can be useful to decrease the soreness of ulcers. Consult a caregiver for the dosing.  Liquids or ointments with a numbing ingredient may be helpful when used as recommended.  Older children and teenagers can rinse their mouth with a salt-water mixture (1/2 teaspoon of salt in 8 ounces of water) four times a day. This treatment is uncomfortable but may reduce the time the ulcers are present.  There are many over-the-counter throat lozenges and medications available for oral ulcers. Their effectiveness has not been studied.  Consult your medical caregiver prior to using homeopathic treatments for oral ulcers. SEEK MEDICAL CARE IF:   You think your child needs to be seen.  The pain worsens and you cannot control it.  There are 4 or more ulcers.  The lips and gums begin to bleed and crust.  A single mouth ulcer is near a tooth that is causing a toothache or pain.  Your child has a fever, swollen face, or swollen glands.  The ulcers began after starting a medication.  Mouth ulcers keep reoccurring or last more than 2 weeks.  You think your child is not taking adequate fluids. SEEK IMMEDIATE MEDICAL CARE IF:   Your child has a high fever.  Your child is unable to swallow or becomes dehydrated.  Your child looks or acts very ill.  An ulcer caused by a chemical your child accidentally put in their mouth. Document Released: 12/07/2004 Document Revised: 03/16/2014 Document Reviewed: 07/22/2009 ExitCare Patient Information 2015 [employer]. This information is not intended to replace advice   given to you by your health care provider. Make sure you discuss any questions you have with your health care provider.  

## 2015-06-23 ENCOUNTER — Ambulatory Visit: Payer: BLUE CROSS/BLUE SHIELD | Admitting: Licensed Clinical Social Worker

## 2015-06-25 ENCOUNTER — Ambulatory Visit (INDEPENDENT_AMBULATORY_CARE_PROVIDER_SITE_OTHER): Payer: Medicare Other | Admitting: Licensed Clinical Social Worker

## 2015-06-25 DIAGNOSIS — F4321 Adjustment disorder with depressed mood: Secondary | ICD-10-CM

## 2015-06-25 DIAGNOSIS — F4310 Post-traumatic stress disorder, unspecified: Secondary | ICD-10-CM | POA: Diagnosis not present

## 2015-06-25 DIAGNOSIS — F332 Major depressive disorder, recurrent severe without psychotic features: Secondary | ICD-10-CM

## 2015-06-25 NOTE — Progress Notes (Signed)
THERAPIST PROGRESS NOTE  Session Time: 3:10 p.m. - 4:20 p.m.  Participation Level: Active  Behavioral Response: CasualAlertAnxious and Depressed  Type of Therapy: Individual Therapy  Treatment Goals addressed: Coping/Bereavement Support  Interventions: Strength-based and Supportive  Summary: Taylor Thompson is a 59 y.o. female who presents with set back in depressive symptoms following mother's death about two weeks ago.  Taylor Thompson's mother died at a hospice facility which is why she re-scheduled previous OPT appointment.  "I've been feeling very lost, very empty." Has continued to have health problems with return of mouth sores/blisters that she was told by medical provider is likely stress related.  She and family celebrated what would have been grand-son's 57th birthday and a few days later her mother's birthday.  Grief reactions experienced were discussed.  Oldest grand-daughter, Taylor Thompson, just turned 30 and upcoming birthday of twin grand-children so Taylor Thompson is trying to look forward to these events and time with family.   Taylor Thompson talked about her experiences with spending time recently with her daughter and grand-children and although feeling tired due to traveling to [place] to stay with them, she and daughter are getting along and the visits are easier.  Additional stress is that oldest brother, Taylor Thompson, has throat cancer and will start chemotherapy soon.  "We've had so much tragic death in our family."  Taylor Thompson reflected on other deaths in the family over the past several years.  Having self reported spiritual dilemmas as a result of so many losses.  She was unable to cite reasons for her feeling yet ended relationship with long term female friend stating "I felt like I was cheating on Taylor Thompson."  Her former husband who died a few years back of cancer even though the two were separated.  Taylor Thompson with good insight though that she did not want the same things out of the relationship that her then  boyfriend wanted.  She denied feeling sad or upset over this.  She was receptive to a book that Taylor Thompson loaned her about coping with the loss of a loved one especially given the number of losses she has had.  Taylor Thompson spoke optimistically about previous support while attending [facility] until the [facility] she was attending had others to take over the group and she felt that she was not receiving the same level of comfort and encouragement.  Taylor Thompson Taylor Thompson for providing information about other local faith based groups who offer [facility].  Client was able to identify this as a possible way to get her needs met in terms of her grief and was also receptive to information about local hospice agencies offering grief counseling/support and groups.  Taylor Thompson follows up regularly with Psychiatrist and although she is feeling down she denied SI or HI and is realistic that her grief is the likely explanation for decreased interests and motivation.  Her brother lives with her and she appreciates that he offers her much encouragement and support.  She maintains additional contact with a few extended family members such as cousins and keeps in contact with all of her brothers.  Suicidal/Homicidal: Negativewithout intent/plan   Therapist Response:  Offered ongoing emotional and social support and reinforced client's available strengths and resiliency factors.  Shared with her the names and locations of at least 3 Churches locally that have a [facility] program.  Also discussed with Taylor Thompson the availability of hospice agencies/bereavement groups and counseling and suggested a call to inquire about support group dates, times, locations.  Support and education  around stages of grief and the impact of multiple losses on how easily or not a person feels over-whelmed.  Normalized her feelings and set back in symptoms and commended Taylor Thompson for talking through her thoughts and feelings.  Gently encouraged to  consider what opportunities or life lessons she may identify as a result of her many losses. Offered condolences and welcomed calls to clinic PRN.  Plan: Return again in two- four weeks.  Continue to work with Dr. Nicolasa Thompson regarding medication management.  Taylor Thompson will make efforts to do a little more each day while also not over-extending herself.  She will keep all appointments.  Diagnosis: Major Depressive Disorder, Recurrent, Severe, Without Psychosis   PTSD   Grief   Taylor Thompson, Taylor Thompson 06/25/2015

## 2015-06-28 DIAGNOSIS — F4310 Post-traumatic stress disorder, unspecified: Secondary | ICD-10-CM | POA: Insufficient documentation

## 2015-06-28 DIAGNOSIS — F4321 Adjustment disorder with depressed mood: Secondary | ICD-10-CM | POA: Insufficient documentation

## 2015-06-28 DIAGNOSIS — F332 Major depressive disorder, recurrent severe without psychotic features: Secondary | ICD-10-CM | POA: Insufficient documentation

## 2015-08-18 ENCOUNTER — Encounter: Payer: Self-pay | Admitting: Licensed Clinical Social Worker

## 2015-08-18 ENCOUNTER — Ambulatory Visit (INDEPENDENT_AMBULATORY_CARE_PROVIDER_SITE_OTHER): Payer: Medicare Other | Admitting: Licensed Clinical Social Worker

## 2015-08-18 DIAGNOSIS — F331 Major depressive disorder, recurrent, moderate: Secondary | ICD-10-CM

## 2015-08-18 DIAGNOSIS — F4310 Post-traumatic stress disorder, unspecified: Secondary | ICD-10-CM

## 2015-08-18 NOTE — Progress Notes (Signed)
THERAPIST PROGRESS NOTE  Session Time: 1:03 p.m. - 2:15 p.m.  Participation Level: Active  Behavioral Response: NeatAlertAnxious and Depressed  Type of Therapy: Individual Therapy  Treatment Goals addressed: Anxiety and Coping  Interventions: CBT, Solution Focused, Strength-based and Supportive  Summary: Taylor Thompson is a 59 y.o. female who presents with much improved anxiety and depression and without recent panic attacks.  She recently enjoyed an 8 day trip to [place] with her brother.  "I'm doing better. Yes, I'm doing good."  Her blood sugars are stable too.    Denied problems with mood or anxiety and is very satisfied with current psychotropics as prescribed by Dr. Nicolasa Ducking. "I think my medications have got me evened out."  She still has episodic anxiety in crowds like going to the gym.  [name] is going to the gym a few days per week and is almost finished reading an inspirational  book and also enjoys playing Solitaire on the computer.  The book that she is reading has inspired her to have more confidence and live life to the fullest.  She is still talking to a female friend from school but nothing romantic.  [name] voiced being okay in terms of her grief regarding her mother's death stating "It is really a relief. She's not suffering anymore."  New stressors due to brother's throat cancer and even with chemotherapy the cancer is returning and a cousin with severe Bi-Polar disorder who has been scammed by her own son.  The cousin will call crying about being broke and in need of money.  "I'm caught in the middle."  [name] is going to take client to her next Psychiatric appointment.  Her cousin, [name]'s sons are in poor health and they seem to depend on [name] to help the cousin.  "I don't want to do it."  She is comfortable setting boundaries with her daughter who would want her to come and stay weeks at a time in [place] and has also said no to helping out the  cousin.  [name] voiced appropriate feelings of disappointment regarding LCSW's resignation and accepted the letter provided with additional resources.  She has spoken in the past to counselors at hospice yet at this time indicated that she feels okay not being in counseling on a regular basis and declined need to follow up with LCSW prior to last day of 09/17/15.  She expressed gratitude to LCSW for support and help through the years & denied questions or concerns.  Suicidal/Homicidal: Negativewithout intent/plan  Therapist Response:  Offered ongoing emotional and social support and reinforced client's available strengths and resiliency factors & the importance of setting boundaries with herself and others to avoid over-extending herself.  Active listening and commended [name] on adding more activities to her week and reaching out through reading to find meaning in her losses.  Discussed LCSW's resignation from current position and provided client with a list of other OPT.  Also encouraged her to reach out to her insurance about in-network providers and to discuss any future OPT needs with Dr. Nicolasa Ducking.  [name] for trusting LCSW all these years with her intimate and painful life experiences and emotions, reinforced her progress, coping skills and personal resources and wished her well.  Plan: Terminated services with client.  [name] will continue to work with Dr. Nicolasa Ducking regarding medication management & has list of OPT providers to follow up with PRN.  [name] will make efforts to do a little more each day while also not  over-extending herself.   Diagnosis: Major Depressive Disorder, Recurrent, Severe, Without Psychosis   PTSD   Grief   [name], LCSW 08/18/2015

## 2015-10-05 ENCOUNTER — Ambulatory Visit: Payer: BLUE CROSS/BLUE SHIELD | Admitting: Licensed Clinical Social Worker

## 2015-11-01 ENCOUNTER — Ambulatory Visit (INDEPENDENT_AMBULATORY_CARE_PROVIDER_SITE_OTHER): Payer: Medicare Other | Admitting: Licensed Clinical Social Worker

## 2015-11-01 DIAGNOSIS — F332 Major depressive disorder, recurrent severe without psychotic features: Secondary | ICD-10-CM | POA: Diagnosis not present

## 2015-11-02 NOTE — Progress Notes (Signed)
   THERAPIST PROGRESS NOTE  Session Time: 52min  Participation Level: Active  Behavioral Response: Casual and NeatAlertDepressed  Type of Therapy: Individual Therapy  Treatment Goals addressed: Coping  Interventions: CBT, Motivational Interviewing, Solution Focused, Supportive, Family Systems and Reframing  Summary: Taylor Thompson is a 59 y.o. female who presents with continued symptoms of her diagnosis.  Patient discussed her frustrations concerning a new therapist.  She states that she has suffered several family losses in the past few years.  She states that she is currently living with her older brother who is suffering from facial cancer.  She reports that she gets stressed since he does not want her out of his sight. Patient having difficulty with stressors (telling her brother no, activities of daily living, self care & finances).  Patient reports that she is taking medication as prescribed by Physician.    Suicidal/Homicidal: Nowithout intent/plan  Therapist Response: LCSW provided Patient with ongoing emotional support and encouragement.  Normalized her feelings.  Commended Patient on her progress and reinforced the importance of client staying focused on her own strengths and resources and resiliency. Processed various strategies for dealing with stressors.    Plan: Return again in 2 weeks.  Diagnosis: Axis I: Major Depression, Recurrent severe    Axis II: No diagnosis    Lubertha [place] 11/02/2015

## 2015-12-09 ENCOUNTER — Ambulatory Visit (INDEPENDENT_AMBULATORY_CARE_PROVIDER_SITE_OTHER): Payer: Medicare Other | Admitting: Licensed Clinical Social Worker

## 2015-12-09 ENCOUNTER — Ambulatory Visit: Payer: BLUE CROSS/BLUE SHIELD | Admitting: Licensed Clinical Social Worker

## 2015-12-09 DIAGNOSIS — F332 Major depressive disorder, recurrent severe without psychotic features: Secondary | ICD-10-CM

## 2015-12-09 DIAGNOSIS — F4321 Adjustment disorder with depressed mood: Secondary | ICD-10-CM | POA: Diagnosis not present

## 2015-12-15 NOTE — Progress Notes (Signed)
   THERAPIST PROGRESS NOTE  Session Time: 28min  Participation Level: Active  Behavioral Response: CasualAlertDepressed  Type of Therapy: Individual Therapy  Treatment Goals addressed: Coping  Interventions: CBT, Motivational Interviewing, Solution Focused, Family Systems and Reframing  Summary: Taylor Thompson is a 60 y.o. female who presents with continued symptoms of her diagnosis.  Patient's brother whom she was living with recently passed away.  She reports that her symptoms are rocky and some days are better than others. Discussed stages of grief and reassured her thoughts and emotions.  Reviewed with her coping skills that may be useful.   Suicidal/Homicidal: Nowithout intent/plan  Therapist Response: LCSW provided Patient with ongoing emotional support and encouragement.  Normalized her feelings.  Commended Patient on her progress and reinforced the importance of client staying focused on her own strengths and resources and resiliency. Processed various strategies for dealing with stressors.    Plan: Return again in 2 weeks.  Diagnosis: Axis I: Major Depression, Recurrent severe    Axis II: No diagnosis    Lubertha South 12/10/2015

## 2016-01-03 ENCOUNTER — Ambulatory Visit: Payer: BLUE CROSS/BLUE SHIELD | Admitting: Licensed Clinical Social Worker

## 2016-08-04 ENCOUNTER — Ambulatory Visit
Admission: EM | Admit: 2016-08-04 | Discharge: 2016-08-04 | Disposition: A | Discharge status: Home / Self Care | Payer: Medicare Other | Attending: Family Medicine | Admitting: Family Medicine

## 2016-08-04 DIAGNOSIS — M542 Cervicalgia: Secondary | ICD-10-CM | POA: Diagnosis not present

## 2016-08-04 DIAGNOSIS — S161XXA Strain of muscle, fascia and tendon at neck level, initial encounter: Secondary | ICD-10-CM

## 2016-08-04 MED ORDER — CYCLOBENZAPRINE HCL 10 MG PO TABS: 10.0000 mg | ORAL_TABLET | Freq: Every day | ORAL | 0 refills | Status: DC

## 2016-08-04 MED ORDER — KETOROLAC TROMETHAMINE 60 MG/2ML IM SOLN
60.0000 mg | Freq: Once | INTRAMUSCULAR | Status: AC
  Administered 2016-08-04: 60 mg via INTRAMUSCULAR

## 2016-08-04 MED ORDER — OXYCODONE-ACETAMINOPHEN 5-325 MG PO TABS: 1.0000 | ORAL_TABLET | Freq: Three times a day (TID) | ORAL | 0 refills | Status: DC | PRN

## 2016-08-04 NOTE — ED Provider Notes (Addendum)
[facility]    CSN: [id number] Arrival date & time: 08/04/16  1336  First Provider Contact:  None       History   Chief Complaint Chief Complaint  Patient presents with  . Neck Pain    HPI Taylor Thompson is a 60 y.o. female.   The history is provided by the patient.  Neck Pain  Pain location:  Occipital region Quality:  Aching Pain radiates to:  Does not radiate Pain severity:  Moderate Pain is:  Same all the time Onset quality:  Gradual Duration:  2 weeks Timing:  Constant Progression:  Unchanged Chronicity:  New Context: not fall, not jumping from heights, not lifting a heavy object, not MCA, not MVC, not pedestrian accident and not recent injury   Relieved by:  Nothing Ineffective treatments:  NSAIDs Associated symptoms: no bladder incontinence, no bowel incontinence, no chest pain, no fever, no headaches, no leg pain, no numbness, no paresis, no photophobia, no syncope, no tingling, no visual change, no weakness and no weight loss   Risk factors: no hx of head and neck radiation, no hx of osteoporosis, no hx of spinal trauma, no recent epidural, no recent head injury and no recurrent falls     History reviewed. No pertinent past medical history.  Patient Active Problem List   Diagnosis Date Noted  . Major depressive disorder, recurrent, severe without psychotic features ([place]) 06/28/2015  . PTSD (post-traumatic stress disorder) 06/28/2015  . Grief 06/28/2015    Past Surgical History:  Procedure Laterality Date  . ABDOMINAL HYSTERECTOMY    . CHOLECYSTECTOMY  2009  . SQUAMOUS CELL CARCINOMA EXCISION  2014    OB History    No data available       Home Medications    Prior to Admission medications   Medication Sig Start Date End Date Taking? Authorizing Provider  ARIPiprazole (ABILIFY) 5 MG tablet  03/21/15  Yes Historical Provider, MD  atorvastatin (LIPITOR) 40 MG tablet  03/21/15  Yes Historical Provider, MD  clonazePAM (KLONOPIN) 0.5 MG  tablet  04/18/15  Yes Historical Provider, MD  escitalopram (LEXAPRO) 10 MG tablet  04/18/15  Yes Historical Provider, MD  ESTRACE VAGINAL 0.1 MG/GM vaginal cream  03/08/15  Yes Historical Provider, MD  HUMALOG KWIKPEN 100 UNIT/ML KiwkPen  03/30/15  Yes Historical Provider, MD  hydrOXYzine (ATARAX/VISTARIL) 50 MG tablet  04/14/15  Yes Historical Provider, MD  ibuprofen (ADVIL,MOTRIN) 800 MG tablet  02/17/15  Yes Historical Provider, MD  LANTUS SOLOSTAR 100 UNIT/ML Solostar Pen  02/17/15  Yes Historical Provider, MD  metformin (FORTAMET) 1000 MG (OSM) 24 hr tablet  02/21/15  Yes Historical Provider, MD  methocarbamol (ROBAXIN) 500 MG tablet  02/17/15  Yes Historical Provider, MD  sertraline (ZOLOFT) 100 MG tablet  03/21/15  Yes Historical Provider, MD  traZODone (DESYREL) 100 MG tablet  03/30/15  Yes Historical Provider, MD  triamcinolone (KENALOG) 0.1 % paste Use as directed 1 application in the mouth or throat 2 (two) times daily. 06/17/15  Yes Lorin Picket, PA-C  cyclobenzaprine (FLEXERIL) 10 MG tablet Take 1 tablet (10 mg total) by mouth at bedtime. 08/04/16   Norval Gable, MD  famciclovir (FAMVIR) 250 MG tablet Take 1 tablet (250 mg total) by mouth 2 (two) times daily. 06/17/15   Lorin Picket, PA-C  HYDROcodone-acetaminophen (NORCO/VICODIN) 5-325 MG per tablet Take 1-2 tablets by mouth every 6 (six) hours as needed for severe pain. 06/17/15   Lorin Picket, PA-C  oxyCODONE-acetaminophen (  PERCOCET/ROXICET) 5-325 MG tablet Take 1-2 tablets by mouth every 8 (eight) hours as needed for severe pain. 08/04/16   Norval Gable, MD    Family History History reviewed. No pertinent family history.  Social History Social History  Substance Use Topics  . Smoking status: Never Smoker  . Smokeless tobacco: Never Used  . Alcohol use No     Allergies   Codeine and Sulfa antibiotics   Review of Systems Review of Systems  Constitutional: Negative for fever and weight loss.  Eyes: Negative for photophobia.    Cardiovascular: Negative for chest pain and syncope.  Gastrointestinal: Negative for bowel incontinence.  Genitourinary: Negative for bladder incontinence.  Musculoskeletal: Positive for neck pain.  Neurological: Negative for tingling, weakness, numbness and headaches.     Physical Exam Triage Vital Signs ED Triage Vitals  Enc Vitals Group     BP 08/04/16 1437 138/86     Pulse Rate 08/04/16 1437 (!) 109     Resp 08/04/16 1437 18     Temp 08/04/16 1437 97.7 F (36.5 C)     Temp Source 08/04/16 1437 Tympanic     SpO2 08/04/16 1437 97 %     Weight 08/04/16 1439 197 lb (89.4 kg)     Height 08/04/16 1439 5' (1.524 m)     Head Circumference --      Peak Flow --      Pain Score 08/04/16 1439 8     Pain Loc --      Pain Edu? --      Excl. in [place]? --    No data found.   Updated Vital Signs BP 138/86 (BP Location: Left Arm)   Pulse (!) 109   Temp 97.7 F (36.5 C) (Tympanic)   Resp 18   Ht 5' (1.524 m)   Wt 197 lb (89.4 kg)   SpO2 97%   BMI 38.47 kg/m   Visual Acuity Right Eye Distance:   Left Eye Distance:   Bilateral Distance:    Right Eye Near:   Left Eye Near:    Bilateral Near:     Physical Exam  Constitutional: She is oriented to person, place, and time. She appears well-developed and well-nourished. No distress.  HENT:  Head: Normocephalic and atraumatic.  Eyes: EOM are normal. Pupils are equal, round, and reactive to light.  Neck: Normal range of motion. Neck supple. No tracheal deviation present. No thyromegaly present.  Musculoskeletal: She exhibits no edema.       Cervical back: She exhibits tenderness (over the paraspinous muscles) and spasm. She exhibits normal range of motion, no bony tenderness, no swelling, no edema, no deformity, no laceration and normal pulse.  Lymphadenopathy:    She has no cervical adenopathy.  Neurological: She is alert and oriented to person, place, and time. She has normal reflexes. No cranial nerve deficit. She exhibits  normal muscle tone. Coordination normal.  Skin: She is not diaphoretic.  Nursing note and vitals reviewed.    UC Treatments / Results  Labs (all labs ordered are listed, but only abnormal results are displayed) Labs Reviewed - No data to display  EKG  EKG Interpretation None       Radiology No results found.  Procedures Procedures (including critical care time)  Medications Ordered in UC Medications  ketorolac (TORADOL) injection 60 mg (60 mg Intramuscular Given 08/04/16 1521)     Initial Impression / Assessment and Plan / UC Course  I have reviewed the triage vital signs and  the nursing notes.  Pertinent labs & imaging results that were available during my care of the patient were reviewed by me and considered in my medical decision making (see chart for details).  Clinical Course      Final Clinical Impressions(s) / UC Diagnoses   Final diagnoses:  Neck pain  Neck strain, initial encounter    New Prescriptions Discharge Medication List as of 08/04/2016  3:30 PM    START taking these medications   Details  cyclobenzaprine (FLEXERIL) 10 MG tablet Take 1 tablet (10 mg total) by mouth at bedtime., Starting Fri 08/04/2016, Normal    oxyCODONE-acetaminophen (PERCOCET/ROXICET) 5-325 MG tablet Take 1-2 tablets by mouth every 8 (eight) hours as needed for severe pain., Starting Fri 08/04/2016, Print       1.  diagnosis reviewed with patient; given toradol 60mg  im x1 2. rx as per orders above; reviewed possible side effects, interactions, risks and benefits  3. Recommend supportive treatment with gentle stretching, heat/ice 4. Follow-up prn if symptoms worsen or don't improve   Norval Gable, MD 08/04/16 [place], MD 08/04/16 [phone]

## 2016-08-04 NOTE — ED Triage Notes (Addendum)
Patient c/o neck pain. The pain shoots down her neck into her spine down her back. This has been happening for about 2 weeks. Its hard for her to brush her hair with her right hand. She has been using ice packs to help relieve pain, and she has tried to use the heating pad. Her daughter popped her neck and at first it felt like it helped but then is started to hurt and it wont go away. Her last blood glucose check today was 355

## 2017-11-28 ENCOUNTER — Encounter: Payer: Self-pay | Admitting: *Deleted

## 2017-11-28 ENCOUNTER — Ambulatory Visit (INDEPENDENT_AMBULATORY_CARE_PROVIDER_SITE_OTHER): Payer: BLUE CROSS/BLUE SHIELD

## 2017-11-28 ENCOUNTER — Ambulatory Visit
Admission: EM | Admit: 2017-11-28 | Discharge: 2017-11-28 | Disposition: A | Discharge status: Home / Self Care | Payer: BLUE CROSS/BLUE SHIELD | Attending: Family Medicine | Admitting: Family Medicine

## 2017-11-28 DIAGNOSIS — S7002XA Contusion of left hip, initial encounter: Secondary | ICD-10-CM

## 2017-11-28 DIAGNOSIS — M25561 Pain in right knee: Secondary | ICD-10-CM | POA: Diagnosis not present

## 2017-11-28 DIAGNOSIS — M545 Low back pain: Secondary | ICD-10-CM

## 2017-11-28 DIAGNOSIS — W108XXA Fall (on) (from) other stairs and steps, initial encounter: Secondary | ICD-10-CM | POA: Diagnosis not present

## 2017-11-28 DIAGNOSIS — S20212A Contusion of left front wall of thorax, initial encounter: Secondary | ICD-10-CM

## 2017-11-28 DIAGNOSIS — M25562 Pain in left knee: Secondary | ICD-10-CM

## 2017-11-28 DIAGNOSIS — R52 Pain, unspecified: Secondary | ICD-10-CM

## 2017-11-28 HISTORY — DX: Disorder of thyroid, unspecified: E07.9

## 2017-11-28 HISTORY — DX: Type 2 diabetes mellitus without complications: E11.9

## 2017-11-28 MED ORDER — OXYCODONE-ACETAMINOPHEN 5-325 MG PO TABS: 1.0000 | ORAL_TABLET | Freq: Three times a day (TID) | ORAL | 0 refills | Status: DC | PRN

## 2017-11-28 NOTE — Discharge Instructions (Signed)
Rest, ice

## 2017-11-28 NOTE — ED Triage Notes (Signed)
Pt wearing socks, going down stairs at home and foot slipped on step, pt fell landing on left side. Now c/o low back, left hip and bilat knees with multiple bruises. No head injury and no loss of consciousness.

## 2017-12-01 ENCOUNTER — Telehealth: Payer: Self-pay

## 2017-12-01 NOTE — Telephone Encounter (Signed)
Called to follow up with patient since visit here at [facility]. Patient instructed to call back with any questions or concerns. MAH  

## 2017-12-03 ENCOUNTER — Other Ambulatory Visit: Payer: Self-pay

## 2017-12-03 ENCOUNTER — Ambulatory Visit
Admission: EM | Admit: 2017-12-03 | Discharge: 2017-12-03 | Disposition: A | Discharge status: Other Acute Inpatient Hospital | Payer: Medicare Other | Attending: Emergency Medicine | Admitting: Emergency Medicine

## 2017-12-03 ENCOUNTER — Encounter: Payer: Self-pay | Admitting: Emergency Medicine

## 2017-12-03 DIAGNOSIS — R3915 Urgency of urination: Secondary | ICD-10-CM | POA: Diagnosis present

## 2017-12-03 DIAGNOSIS — M5442 Lumbago with sciatica, left side: Secondary | ICD-10-CM | POA: Diagnosis not present

## 2017-12-03 DIAGNOSIS — E119 Type 2 diabetes mellitus without complications: Secondary | ICD-10-CM | POA: Insufficient documentation

## 2017-12-03 DIAGNOSIS — M545 Low back pain: Secondary | ICD-10-CM

## 2017-12-03 DIAGNOSIS — Z8249 Family history of ischemic heart disease and other diseases of the circulatory system: Secondary | ICD-10-CM | POA: Diagnosis not present

## 2017-12-03 DIAGNOSIS — F4310 Post-traumatic stress disorder, unspecified: Secondary | ICD-10-CM | POA: Diagnosis not present

## 2017-12-03 DIAGNOSIS — Z79899 Other long term (current) drug therapy: Secondary | ICD-10-CM | POA: Insufficient documentation

## 2017-12-03 DIAGNOSIS — Z85828 Personal history of other malignant neoplasm of skin: Secondary | ICD-10-CM | POA: Insufficient documentation

## 2017-12-03 DIAGNOSIS — E079 Disorder of thyroid, unspecified: Secondary | ICD-10-CM | POA: Insufficient documentation

## 2017-12-03 DIAGNOSIS — Z794 Long term (current) use of insulin: Secondary | ICD-10-CM | POA: Diagnosis not present

## 2017-12-03 DIAGNOSIS — M5441 Lumbago with sciatica, right side: Secondary | ICD-10-CM | POA: Insufficient documentation

## 2017-12-03 DIAGNOSIS — R32 Unspecified urinary incontinence: Secondary | ICD-10-CM | POA: Insufficient documentation

## 2017-12-03 DIAGNOSIS — F339 Major depressive disorder, recurrent, unspecified: Secondary | ICD-10-CM | POA: Diagnosis not present

## 2017-12-03 LAB — URINALYSIS, COMPLETE (UACMP) WITH MICROSCOPIC
BILIRUBIN URINE: NEGATIVE
Bacteria, UA: NONE SEEN
Glucose, UA: >1000 mg/dL — AB
Hgb urine dipstick: NEGATIVE
KETONES UR: 40 mg/dL — AB
LEUKOCYTES UA: NEGATIVE
Nitrite: NEGATIVE
PROTEIN: NEGATIVE mg/dL
RBC / HPF: NONE SEEN RBC/hpf (ref 0–5)
Specific Gravity, Urine: <1.005 — ABNORMAL LOW (ref 1.005–1.030)
WBC UA: NONE SEEN WBC/hpf (ref 0–5)
pH: 5.5 (ref 5.0–8.0)

## 2017-12-03 MED ORDER — FLUCONAZOLE 150 MG PO TABS: 150.0000 mg | ORAL_TABLET | Freq: Every day | ORAL | 1 refills | Status: AC

## 2017-12-03 NOTE — ED Triage Notes (Signed)
Patient states that she fall down some stairs and was seen here last Wed for this.  Patient states that since then she has had urinary urgency and some burning when urinating.

## 2017-12-03 NOTE — ED Provider Notes (Signed)
[facility]    CSN: [id number] Arrival date & time: 12/03/17  1601     History   Chief Complaint Chief Complaint  Patient presents with  . Urinary Urgency  . Dysuria    HPI Taylor Thompson is a 62 y.o. female.   Patient is a 62 year old female who presents with complaint of urinary urgency and concern for a possible UTI.  Patient states she fell last Tuesday night and was seen here in urgent care on Wednesday.  Since her follow-up she has had urinary incontinence.  She states that when she gets the urge to urinate she really has no control of her bladder and begins urinating on the floor as she is walking on the hallway.  Given this what she called urgency, she believes she had a UTI and a possible yeast infection due to itching.  She states she had left hip and lower back pain last week when she was seen but now has pain on the right side as well along with some shooting pain down the legs.  She does report difficulty walking due to the pain.  She denies any distal numbness or tingling since her fall.  She does report some occasional dizziness.  She was given some Norco at her last visit but states she is only taken 3 of the 8 pills she was given.      Past Medical History:  Diagnosis Date  . Diabetes mellitus without complication ([place])   . Thyroid disease     Patient Active Problem List   Diagnosis Date Noted  . Major depressive disorder, recurrent, severe without psychotic features ([place]) 06/28/2015  . PTSD (post-traumatic stress disorder) 06/28/2015  . Grief 06/28/2015    Past Surgical History:  Procedure Laterality Date  . ABDOMINAL HYSTERECTOMY    . CHOLECYSTECTOMY  2009  . SQUAMOUS CELL CARCINOMA EXCISION  2014    OB History    No data available       Home Medications    Prior to Admission medications   Medication Sig Start Date End Date Taking? Authorizing Provider  ARIPiprazole (ABILIFY) 5 MG tablet  03/21/15  Yes [provider]    atorvastatin (LIPITOR) 40 MG tablet  03/21/15  Yes [provider]  clonazePAM (KLONOPIN) 0.5 MG tablet  04/18/15  Yes [provider]  cyclobenzaprine (FLEXERIL) 10 MG tablet Take 1 tablet (10 mg total) by mouth at bedtime. 08/04/16  Yes Norval Gable, MD  empagliflozin (JARDIANCE) 10 MG TABS tablet Take 10 mg by mouth daily.   Yes [provider]  empagliflozin (JARDIANCE) 25 MG TABS tablet Take 25 mg by mouth daily.   Yes [provider]  escitalopram (LEXAPRO) 10 MG tablet  04/18/15  Yes [provider]  ESTRACE VAGINAL 0.1 MG/GM vaginal cream  03/08/15  Yes [provider]  famciclovir (FAMVIR) 250 MG tablet Take 1 tablet (250 mg total) by mouth 2 (two) times daily. 06/17/15  Yes Juanda Crumble  HUMALOG KWIKPEN 100 UNIT/ML KiwkPen  03/30/15  Yes [provider]  HYDROcodone-acetaminophen (NORCO/VICODIN) 5-325 MG per tablet Take 1-2 tablets by mouth every 6 (six) hours as needed for severe pain. 06/17/15  Yes Lorin Picket, PA-C  hydrOXYzine (ATARAX/VISTARIL) 50 MG tablet  04/14/15  Yes [provider]  ibuprofen (ADVIL,MOTRIN) 800 MG tablet  02/17/15  Yes [provider]  insulin lispro protamine-lispro (HUMALOG 75/25 MIX) (75-25) 100 UNIT/ML SUSP injection Inject into the skin.   Yes [provider]  LANTUS SOLOSTAR 100 UNIT/ML Solostar Pen  02/17/15  Yes [provider]  levothyroxine (SYNTHROID, LEVOTHROID) 25 MCG tablet Take 25 mcg by mouth daily before breakfast.   Yes [provider]  metformin (FORTAMET) 1000 MG (OSM) 24 hr tablet  02/21/15  Yes [provider]  methocarbamol (ROBAXIN) 500 MG tablet  02/17/15  Yes [provider]  oxyCODONE-acetaminophen (PERCOCET/ROXICET) 5-325 MG tablet Take 1-2 tablets by mouth every 8 (eight) hours as needed for severe pain. 11/28/17  Yes Norval Gable, MD  sertraline (ZOLOFT) 100 MG tablet  03/21/15  Yes [provider]   traZODone (DESYREL) 100 MG tablet  03/30/15  Yes [provider]  triamcinolone (KENALOG) 0.1 % paste Use as directed 1 application in the mouth or throat 2 (two) times daily. 06/17/15  Yes Lorin Picket, PA-C  fluconazole (DIFLUCAN) 150 MG tablet Take 1 tablet (150 mg total) by mouth daily for 1 dose. 12/03/17 12/04/17  Luvenia Redden, PA-C    Family History Family History  Problem Relation Age of Onset  . Dementia Mother   . CAD Mother   . Diabetes Mother   . Hypertension Mother   . Osteoarthritis Mother   . Cancer Father   . Cirrhosis Father     Social History Social History   Tobacco Use  . Smoking status: Never Smoker  . Smokeless tobacco: Never Used  Substance Use Topics  . Alcohol use: No    Alcohol/week: 0.0 oz  . Drug use: No     Allergies   Codeine and Sulfa antibiotics   Review of Systems Review of Systems  As noted above in HPI.  Other systems reviewed and found to be negative.   Physical Exam Triage Vital Signs ED Triage Vitals  Enc Vitals Group     BP 12/03/17 1625 123/76     Pulse Rate 12/03/17 1625 96     Resp 12/03/17 1625 16     Temp 12/03/17 1625 98.2 F (36.8 C)     Temp Source 12/03/17 1625 Oral     SpO2 12/03/17 1625 98 %     Weight 12/03/17 1622 198 lb (89.8 kg)     Height 12/03/17 1622 5' (1.524 m)     Head Circumference --      Peak Flow --      Pain Score 12/03/17 1622 6     Pain Loc --      Pain Edu? --      Excl. in [place]? --    No data found.  Updated Vital Signs BP 123/76 (BP Location: Left Arm)   Pulse 96   Temp 98.2 F (36.8 C) (Oral)   Resp 16   Ht 5' (1.524 m)   Wt 198 lb (89.8 kg)   SpO2 98%   BMI 38.67 kg/m    Physical Exam  Constitutional: She is oriented to person, place, and time. She appears well-developed and well-nourished.  Slow moving and getting up out of the chair and off and on exam table.  HENT:  Head: Normocephalic and atraumatic.  Abdominal: Soft. Normal appearance and bowel  sounds are normal. There is tenderness in the suprapubic area.  Musculoskeletal: Normal range of motion. She exhibits no edema, tenderness or deformity.       Lumbar back: She exhibits no bony tenderness, no deformity and no spasm.  Neurological: She is alert and oriented to person, place, and time. No cranial nerve deficit or sensory deficit. Coordination  normal.  Skin: Skin is warm and dry.  Psychiatric: She has a normal mood and affect. Her behavior is normal.     UC Treatments / Results  Labs (all labs ordered are listed, but only abnormal results are displayed) Labs Reviewed  URINALYSIS, COMPLETE (UACMP) WITH MICROSCOPIC - Abnormal; Notable for the following components:      Result Value   Specific Gravity, Urine <1.005 (*)    Glucose, UA >1000 (*)    Ketones, ur 40 (*)    Squamous Epithelial / LPF 0-5 (*)    All other components within normal limits  URINE CULTURE    EKG  EKG Interpretation None       Radiology No results found.  Procedures Procedures (including critical care time)  Medications Ordered in UC Medications - No data to display   Initial Impression / Assessment and Plan / UC Course  I have reviewed the triage vital signs and the nursing notes.  Pertinent labs & imaging results that were available during my care of the patient were reviewed by me and considered in my medical decision making (see chart for details).    Pt with new urinary incontinence since her fall last week and now with right sided back pain. With the worsening pain, new urinary incontinence, in sciatic type pain, there is concern for possible cauda equina syndrome.  Patient advised that she needs to be evaluated in the ER and imaging with MRI to further evaluate her symptoms.  Patient states she will go to the ER since she is followed there has [facility] does not have the neurology or orthopedic services available.  Final Clinical Impressions(s) / UC Diagnoses   Final  diagnoses:  Urinary incontinence, unspecified type  Acute bilateral low back pain, with sciatica presence unspecified   Patient to go to ER for further evaluation. Pt give prescription for yeast infection symptoms.  ED Discharge Orders        Ordered    fluconazole (DIFLUCAN) 150 MG tablet  Daily     12/03/17 1746       Controlled Substance Prescriptions [place] Controlled Substance Registry consulted? Not Applicable   Luvenia Redden, PA-C 12/03/17 1807

## 2017-12-03 NOTE — Discharge Instructions (Signed)
-  there is concern for spinal injury from your fall with the urinary incontinence and low back pain called "cauda equina syndrome" -recommend you go to the ER for MRI to evaluate for possible injury -Diflucan: one tablet once for yeast infection. May repeat in 7 days if needed

## 2017-12-11 ENCOUNTER — Other Ambulatory Visit: Payer: Self-pay | Admitting: Family Medicine

## 2017-12-11 ENCOUNTER — Ambulatory Visit
Admission: RE | Admit: 2017-12-11 | Discharge: 2017-12-11 | Disposition: A | Discharge status: Home / Self Care | Payer: Medicare Other | Source: Ambulatory Visit | Attending: Family Medicine | Admitting: Family Medicine

## 2017-12-11 DIAGNOSIS — M546 Pain in thoracic spine: Secondary | ICD-10-CM

## 2017-12-11 DIAGNOSIS — W19XXXA Unspecified fall, initial encounter: Secondary | ICD-10-CM | POA: Diagnosis not present

## 2017-12-11 DIAGNOSIS — M40204 Unspecified kyphosis, thoracic region: Secondary | ICD-10-CM | POA: Diagnosis not present

## 2017-12-11 DIAGNOSIS — M48061 Spinal stenosis, lumbar region without neurogenic claudication: Secondary | ICD-10-CM | POA: Diagnosis not present

## 2017-12-17 ENCOUNTER — Other Ambulatory Visit: Payer: Self-pay | Admitting: Physical Medicine and Rehabilitation

## 2017-12-17 ENCOUNTER — Ambulatory Visit
Admission: RE | Admit: 2017-12-17 | Discharge: 2017-12-17 | Disposition: A | Discharge status: Home / Self Care | Payer: Medicare Other | Source: Ambulatory Visit | Attending: Physical Medicine and Rehabilitation | Admitting: Physical Medicine and Rehabilitation

## 2017-12-17 DIAGNOSIS — M48061 Spinal stenosis, lumbar region without neurogenic claudication: Secondary | ICD-10-CM | POA: Diagnosis not present

## 2017-12-17 DIAGNOSIS — M5416 Radiculopathy, lumbar region: Secondary | ICD-10-CM

## 2017-12-17 DIAGNOSIS — R32 Unspecified urinary incontinence: Secondary | ICD-10-CM

## 2018-01-04 ENCOUNTER — Ambulatory Visit: Payer: Self-pay | Admitting: Urology

## 2018-01-18 ENCOUNTER — Ambulatory Visit: Payer: Self-pay | Admitting: Urology

## 2018-01-25 ENCOUNTER — Other Ambulatory Visit: Payer: Self-pay | Admitting: Family Medicine

## 2018-01-25 ENCOUNTER — Ambulatory Visit
Admission: RE | Admit: 2018-01-25 | Discharge: 2018-01-25 | Disposition: A | Discharge status: Home / Self Care | Payer: Medicare Other | Source: Ambulatory Visit | Attending: Family Medicine | Admitting: Family Medicine

## 2018-01-25 DIAGNOSIS — M1712 Unilateral primary osteoarthritis, left knee: Secondary | ICD-10-CM | POA: Insufficient documentation

## 2018-01-25 DIAGNOSIS — M25562 Pain in left knee: Secondary | ICD-10-CM | POA: Diagnosis present

## 2018-02-07 NOTE — Progress Notes (Signed)
02/08/2018 10:42 AM   Taylor Thompson Taylor Thompson [birth year] [id number]  Referring provider: Zeb Comfort, MD [place], [place] [address]  Chief Complaint  Patient presents with  . New Patient (Initial Visit)    HPI: Patient is a 62 -year-old Caucasian female who is referred to [place] by Taylor Sober, MD for urinary incontinence.  Patient states that she has had urinary incontinence since January 15th, 2019 after suffering a fall going down the stairs.   She states she is experiencing urge incontinence several times during the day.    A MRI performed on 12/17/2017 noted no acute or post traumatic finding. No specific cause of the presenting symptoms is identified. Mild non-compressive stenosis at L3-4 and L4-5 as outlined above. The facet arthropathy could be associated with back pain.  She is having associated urinary urgency and nocturia x 1-2.  Patient denies any gross hematuria, dysuria or suprapubic/flank pain.  Patient denies any fevers, chills, nausea or vomiting.   Her PVR is 20 mL.  She does not have incontinence of stool or numbness in her saddle area.    She does not have a history of urinary tract infections, STI's or injury to the bladder.   She does not have a history of nephrolithiasis, GU surgery or GU trauma.   She is not sexually active.     She is post menopausal.   She admits to diarrhea.    She is not having pain with bladder filling.    She is drinking 3 to 4 bottles of water daily.   She is drinking one caffeinated beverages daily.  She seldom drinks soda, but it is usually diet Gingerale.  No juices.  Not drinking alcohol.      PMH: Past Medical History:  Diagnosis Date  . Acid reflux   . Anxiety   . Arthritis   . Depression   . Diabetes mellitus without complication ([name])   . Hyperlipidemia   . Hypothyroidism   . Skin cancer   . Sleep apnea   . Thyroid disease     Surgical History: Past Surgical History:  Procedure Laterality  Date  . ABDOMINAL HYSTERECTOMY    . CHOLECYSTECTOMY  2009  . SQUAMOUS CELL CARCINOMA EXCISION  2014    Home Medications:  Allergies as of 02/08/2018      Reactions   Codeine    Sulfa Antibiotics       Medication List        Accurate as of 02/08/18 10:42 AM. Always use your most recent med list.          ARIPiprazole 5 MG tablet Commonly known as:  ABILIFY   atorvastatin 40 MG tablet Commonly known as:  LIPITOR   clonazePAM 0.5 MG tablet Commonly known as:  KLONOPIN   cyclobenzaprine 10 MG tablet Commonly known as:  FLEXERIL Take 1 tablet (10 mg total) by mouth at bedtime.   DULoxetine 30 MG capsule Commonly known as:  CYMBALTA   escitalopram 10 MG tablet Commonly known as:  LEXAPRO   ESTRACE VAGINAL 0.1 MG/GM vaginal cream Generic drug:  estradiol   famciclovir 250 MG tablet Commonly known as:  FAMVIR Take 1 tablet (250 mg total) by mouth 2 (two) times daily.   HUMALOG KWIKPEN 100 UNIT/ML KiwkPen Generic drug:  insulin lispro   HYDROcodone-acetaminophen 5-325 MG tablet Commonly known as:  NORCO/VICODIN Take 1-2 tablets by mouth every 6 (six) hours as needed for severe pain.   hydrOXYzine 50  MG tablet Commonly known as:  ATARAX/VISTARIL   ibuprofen 800 MG tablet Commonly known as:  ADVIL,MOTRIN   insulin lispro protamine-lispro (75-25) 100 UNIT/ML Susp injection Commonly known as:  HUMALOG 75/25 MIX Inject into the skin.   JARDIANCE 25 MG Tabs tablet Generic drug:  empagliflozin Take 25 mg by mouth daily.   JARDIANCE 10 MG Tabs tablet Generic drug:  empagliflozin Take 10 mg by mouth daily.   LANTUS SOLOSTAR 100 UNIT/ML Solostar Pen Generic drug:  Insulin Glargine   levothyroxine 25 MCG tablet Commonly known as:  SYNTHROID, LEVOTHROID Take 25 mcg by mouth daily before breakfast.   metformin 1000 MG (OSM) 24 hr tablet Commonly known as:  FORTAMET   methocarbamol 500 MG tablet Commonly known as:  ROBAXIN   oxyCODONE-acetaminophen 5-325 MG  tablet Commonly known as:  PERCOCET/ROXICET Take 1-2 tablets by mouth every 8 (eight) hours as needed for severe pain.   traZODone 100 MG tablet Commonly known as:  DESYREL   triamcinolone 0.1 % paste Commonly known as:  KENALOG Use as directed 1 application in the mouth or throat 2 (two) times daily.   TRULICITY 6.60 YT/0.1SW Sopn Generic drug:  Dulaglutide       Allergies:  Allergies  Allergen Reactions  . Codeine   . Sulfa Antibiotics     Family History: Family History  Problem Relation Age of Onset  . Dementia Mother   . CAD Mother   . Diabetes Mother   . Hypertension Mother   . Osteoarthritis Mother   . Cancer Father   . Cirrhosis Father   . Kidney cancer Neg Hx   . Kidney disease Neg Hx   . Prostate cancer Neg Hx     Social History:  reports that she has never smoked. She has never used smokeless tobacco. She reports that she does not drink alcohol or use drugs.  ROS: UROLOGY Frequent Urination?: No Hard to postpone urination?: Yes Burning/pain with urination?: No Get up at night to urinate?: Yes Leakage of urine?: Yes Urine stream starts and stops?: No Trouble starting stream?: No Do you have to strain to urinate?: No Blood in urine?: Yes Urinary tract infection?: No Sexually transmitted disease?: No Injury to kidneys or bladder?: No Painful intercourse?: Yes Weak stream?: No Currently pregnant?: No Vaginal bleeding?: No Last menstrual period?: n  Gastrointestinal Nausea?: No Vomiting?: No Indigestion/heartburn?: Yes Diarrhea?: Yes Constipation?: No  Constitutional Fever: No Night sweats?: Yes Weight loss?: No Fatigue?: Yes  Skin Skin rash/lesions?: No Itching?: No  Eyes Blurred vision?: Yes Double vision?: No  Ears/Nose/Throat Sore throat?: No Sinus problems?: No  Hematologic/Lymphatic Swollen glands?: No Easy bruising?: No  Cardiovascular Leg swelling?: No Chest pain?: No  Respiratory Cough?: No Shortness of  breath?: No  Endocrine Excessive thirst?: Yes  Musculoskeletal Back pain?: Yes Joint pain?: No  Neurological Headaches?: No Dizziness?: No  Psychologic Depression?: Yes Anxiety?: Yes  Physical Exam: BP (!) 143/84   Pulse 90   Ht 5' (1.524 m)   Wt 190 lb (86.2 kg)   BMI 37.11 kg/m   Constitutional: Well nourished. Alert and oriented, No acute distress. HEENT: [place] AT, moist mucus membranes. Trachea midline, no masses. Cardiovascular: No clubbing, cyanosis, or edema. Respiratory: Normal respiratory effort, no increased work of breathing. GI: Abdomen is soft, non tender, non distended, no abdominal masses. Liver and spleen not palpable.  No hernias appreciated.  Stool sample for occult testing is not indicated.   GU: No CVA tenderness.  No bladder fullness  or masses.  Atrophic external genitalia, normal pubic hair distribution, no lesions.  Normal urethral meatus, no lesions, no prolapse, no discharge.   No urethral masses, tenderness and/or tenderness. No bladder fullness, tenderness or masses. Pale vagina mucosa, poor estrogen effect, no discharge, no lesions, good pelvic support, Grade II cystocele or rectocele noted.  Cervix and uterus are surgically absent.  No adnexal/parametria masses or tenderness noted.  Anus and perineum are without rashes or lesions.    Skin: No rashes, bruises or suspicious lesions. Lymph: No cervical or inguinal adenopathy. Neurologic: Grossly intact, no focal deficits, moving all 4 extremities. Psychiatric: Normal mood and affect.  Laboratory Data: No results found for: WBC, HGB, HCT, MCV, PLT  No results found for: CREATININE  No results found for: PSA  No results found for: TESTOSTERONE  No results found for: HGBA1C  No results found for: TSH  No results found for: CHOL, HDL, CHOLHDL, VLDL, LDLCALC  No results found for: AST No results found for: ALT No components found for: ALKALINEPHOPHATASE No components found for:  BILIRUBINTOTAL  No results found for: ESTRADIOL  Urinalysis    Component Value Date/Time   COLORURINE YELLOW 12/03/2017 [place] 12/03/2017 1644   LABSPEC <1.005 (L) 12/03/2017 1644   PHURINE 5.5 12/03/2017 1644   GLUCOSEU >1000 (A) 12/03/2017 [place] 12/03/2017 [place] 12/03/2017 1644   KETONESUR 40 (A) 12/03/2017 [place] NEGATIVE 12/03/2017 1644   NITRITE NEGATIVE 12/03/2017 1644   LEUKOCYTESUR NEGATIVE 12/03/2017 1644    I have reviewed the labs.   Pertinent Imaging: CLINICAL DATA:  [address] down stairs in January. Low back pain and bilateral leg pain with urinary incontinence.  EXAM: MRI LUMBAR SPINE WITHOUT CONTRAST  TECHNIQUE: Multiplanar, multisequence MR imaging of the lumbar spine was performed. No intravenous contrast was administered.  COMPARISON:  Lumbar and thoracic radiographs 12/11/2017  FINDINGS: Segmentation:  L5 is a transitional vertebra.  Alignment:  Normal  Vertebrae:  No fracture or primary bone lesion.  Conus medullaris and cauda equina: Conus extends to the L1 level. Conus and cauda equina appear normal.  Paraspinal and other soft tissues: Negative  Disc levels:  No abnormality at T11-12, T12-L1 or L1-2.  L2-3: Minimal desiccation of the disc. Minimal facet hypertrophy. No stenosis.  L3-4: Mild bulging of the disc. Moderate facet hypertrophy. Mild canal narrowing without visible neural compression.  L4-5: Mild bulging of the disc. Bilateral facet and ligamentous hypertrophy. Mild canal and foraminal stenosis but without evidence of neural compression.  L5-S1: Transitional level. Normal. Wide patency of the canal and foramina.  IMPRESSION: No acute or post traumatic finding. No specific cause of the presenting symptoms is identified. Mild non-compressive stenosis at L3-4 and L4-5 as outlined above. The facet arthropathy could be associated with back  pain.   Electronically Signed   By: Nelson Chimes M.D.   On: 12/17/2017 18:48     I have independently reviewed the films.    Assessment & Plan:    1. Urge Incontinence  - discussed behavioral therapies, bladder training and bladder control strategies and pelvic floor muscle training - she would like a referral to PT  - fluid management - encouraged her to continue to drink water  - offered medical therapy with anticholinergic therapy or beta-3 adrenergic receptor agonist and the potential side effects of each therapy - she would like to try the beta-3 adrenergic receptor agonist (Myrbetriq).  Given Myrbetriq 25 mg samples, #  28.  I have reviewed with the patient of the side effects of Myrbetriq, such as: elevation in BP, urinary retention and/or HA.   - RTC in 3 weeks for PVR and symptom recheck - if no improvement may consider UDS for further evaluation  2. Vaginal atrophy She is currently on vaginal estrogen cream Encouraged continual use   Return in about 3 weeks (around 03/01/2018) for PVR and OAB questionnaire.  These notes generated with voice recognition software. I apologize for typographical errors.  Zara Council, PA-C  [facility] [address] [place], [place] [address] [phone]

## 2018-02-08 ENCOUNTER — Ambulatory Visit (INDEPENDENT_AMBULATORY_CARE_PROVIDER_SITE_OTHER): Payer: Medicare Other | Admitting: Urology

## 2018-02-08 ENCOUNTER — Encounter: Payer: Self-pay | Admitting: Urology

## 2018-02-08 VITALS — BP 143/84 | HR 90 | Ht 60.0 in | Wt 190.0 lb

## 2018-02-08 DIAGNOSIS — N952 Postmenopausal atrophic vaginitis: Secondary | ICD-10-CM

## 2018-02-08 DIAGNOSIS — R350 Frequency of micturition: Secondary | ICD-10-CM | POA: Diagnosis not present

## 2018-02-08 DIAGNOSIS — N3941 Urge incontinence: Secondary | ICD-10-CM

## 2018-02-08 LAB — BLADDER SCAN AMB NON-IMAGING: Scan Result: 28

## 2018-02-13 ENCOUNTER — Telehealth: Payer: Self-pay

## 2018-02-13 NOTE — Telephone Encounter (Signed)
She can come to the office and pick up Toviaz 8 mg samples in our [place] office.

## 2018-02-13 NOTE — Telephone Encounter (Signed)
Pt called stating she was put on myrbetriq and is having some side effects of the medication. Pt described side effects to be severe constipation. Pt inquired about another medication. Reinforced with pt that the other meds will most likely do the same thing. Pt voiced understanding and stated she would like to at least try. Please advise.

## 2018-02-14 NOTE — Telephone Encounter (Signed)
LMOM- samples left up front.

## 2018-02-15 NOTE — Telephone Encounter (Signed)
Samples were sent to [facility] with [facility] for pt to pick up there.

## 2018-02-19 ENCOUNTER — Other Ambulatory Visit: Payer: Self-pay

## 2018-02-19 ENCOUNTER — Ambulatory Visit: Payer: Medicare Other | Attending: Urology

## 2018-02-19 ENCOUNTER — Ambulatory Visit: Payer: Medicare Other

## 2018-02-19 DIAGNOSIS — M791 Myalgia, unspecified site: Secondary | ICD-10-CM | POA: Insufficient documentation

## 2018-02-19 DIAGNOSIS — R293 Abnormal posture: Secondary | ICD-10-CM

## 2018-02-19 DIAGNOSIS — N3946 Mixed incontinence: Secondary | ICD-10-CM | POA: Insufficient documentation

## 2018-02-19 DIAGNOSIS — M533 Sacrococcygeal disorders, not elsewhere classified: Secondary | ICD-10-CM | POA: Diagnosis not present

## 2018-02-19 DIAGNOSIS — M62838 Other muscle spasm: Secondary | ICD-10-CM | POA: Insufficient documentation

## 2018-02-19 NOTE — Therapy (Signed)
[place] MAIN [facility] SERVICES [address] [place], [place], [address] Phone: [phone]   Fax:  [phone]  Physical Therapy Evaluation  Patient Details  Name: Taylor Thompson MRN: [id number] Date of Birth: 1956/11/13 Referring Provider: Zara Council   Encounter Date: 02/19/2018  PT End of Session - 02/19/18 1438    Visit Number  1    Number of Visits  10    Date for PT Re-Evaluation  04/30/18    Authorization Type  Medicare    Authorization - Visit Number  1    Authorization - Number of Visits  10    PT Start Time  0830    PT Stop Time  0938    PT Time Calculation (min)  68 min    Activity Tolerance  Patient tolerated treatment well    Behavior During Therapy  [facility] for tasks assessed/performed       Past Medical History:  Diagnosis Date  . Acid reflux   . Anxiety   . Arthritis   . Depression   . Diabetes mellitus without complication ([place])   . Hyperlipidemia   . Hypothyroidism   . Skin cancer   . Sleep apnea   . Thyroid disease     Past Surgical History:  Procedure Laterality Date  . ABDOMINAL HYSTERECTOMY  1986   Vaginal  . CHOLECYSTECTOMY  2009  . SQUAMOUS CELL CARCINOMA EXCISION  2014    There were no vitals filed for this visit.       [facility] PT Assessment - 02/19/18 0001      Assessment   Medical Diagnosis  Urge Incontinence    Referring Provider  Zara Council    Onset Date/Surgical Date  11/27/17    Next MD Visit  End of April 2019     Prior Therapy  medicine      Precautions   Precautions  None      Restrictions   Weight Bearing Restrictions  No      Balance Screen   Has the patient fallen in the past 6 months  Yes    How many times?  [place]  Other relatives    Type of [place] to enter    Entrance Stairs-Number of Steps  2    Entrance Stairs-Rails  Right    Home Layout  Two  level    Alternate Level Stairs-Number of Steps  12    Alternate Level Stairs-Rails  None    Home Equipment  None      Prior Function   Level of Independence  Independent    Vocation  Retired    Biomedical scientist  -- prior Geneticist, molecular, traveling to [place], wants to start walking.      Cognition   Overall Cognitive Status  Within Functional Limits for tasks assessed       Pelvic Floor Physical Therapy Evaluation and Assessment  SCREENING  Falls in last 6 mo: yes, 1    Patient's communication preference:   Red Flags:  Have you had any night sweats? no Unexplained weight loss? Lost 9 lbs since Jan. Saddle anesthesia? no Unexplained changes in bowel or bladder habits? no  SUBJECTIVE  Patient reports: She started having uncontrollable urinary and bowel incontinence following fall in January of this  year. She fell down the stairs and landed on her head. She had some success with myrbetriq but still had urge incontinence and became constipated. Has now switched to Lisbeth Ply and is no longer having any incontinence but has severe dry mouth from the medication and it alters her sense of taste. Has started having pain in the LB and B legs (ache). The only thing that helped was oxy but other than that nothing has helped the pain thus far. It is worsyt with walking >20-30 min or standing >10 min. Sitting for >5-10 minutes makes her back hurt.  Was seeing an osteopathic doctor for hip malalignment in 2015.  Knows that she was using the "wrong muscles" when she was trying to deliver her daughter. Has had vaginal spasms in the past.  Is going through [facility] for her anxiety/depression. She feels that it is really helpful.   Precautions:  Skin cancer  Social/Family/Vocational History:   Retires, sedentary, would like to walk more.  Recent Procedures/Tests/Findings:  Had a steroid injection a couple of weeks ago in L knee for arthritis  pain.  Obstetrical History: 1 vaginal delivery some tearing or episiotomy.  Gynecological History: Herpes, had cervical dysplasia that warranted hysterectomy.  Urinary History: Had minimal stress incontinence prior since birth of daughter  Gastrointestinal History: Had multiple 2-3 soft BM's per day prior to recent fall and then became constipated following fall.  Sexual activity/pain: Has had pain for 10 years with intercourse, feels it is due to drying and thinning of vaginal tissue.    Location of pain: B legs and LBP Current pain:  0/10  Max pain:  5/10 Least pain:  0/10 Nature of pain: ache  Patient Goals: To get rid of LBP and leg pain and come off of bladder medication if possible.   OBJECTIVE  Posture/Observations:  Sitting: posterior pelvic tilt. Standing: flat back, shifts into R hip.  R anterior rotation and high PISI (upslip vs. Leg-length)  *has been told that she may have fused vertebrae in the lumbar spine and that her (maybe) R leg is longer from x-ray measurement. (most recent x-ray)  Palpation/Segmental Motion/Joint Play:  Special tests:   Stork: positive for instability B R>L. Decreased mobility as well. Leg-length: R 85 cm, L 84 cm. Supine to long-sit: L long in supine and seated.   Range of Motion/Flexibilty:  Spine: ~30% reduction in R side-bending with pain on R, L side-bend creates "stretch" on R, WNL motion. Hips:   Strength/MMT:  LE MMT  LE MMT Left Right  Hip flex:  (L2) /5 /5  Hip ext: /5 /5  Hip abd: /5 /5  Hip add: /5 /5  Hip IR /5 /5  Hip ER /5 /5     Abdominal:  Palpation: TTP to upper and lower L quadrants and at midline with light pressure. Some TTP at RLQ with light pressure. TTP through Psoas B and QL on L only. Diastasis: 2 finger separation that closes with activation.  Pelvic Floor External Exam: Deferred to next visit Introitus Appears:  Skin integrity:  Palpation: Cough: Prolapse visible?: Scar  mobility:  Internal Vaginal Exam: Deferred to next visit Strength (PERF):  Symmetry: Palpation: Prolapse:   Internal Rectal Exam: Deferred Indefinately Strength (PERF): Symmetry: Palpation: Prolapse:   Gait Analysis: not assessed   Interventions this session: Self-care: Educated on the structure and function of the pelvic floor in relation to their symptoms as well as the POC, and initial HEP in order to set patient expectations  and understanding from which we will build on in the future sessions.    Total time: 68 min.             Objective measurements completed on examination: See above findings.              PT Education - 02/19/18 1437    Education provided  Yes    Education Details  See Interventions this session    Person(s) Educated  Patient    Methods  Explanation;Demonstration;Other (comment) pelvic model    Comprehension  Verbalized understanding       PT Short Term Goals - 02/19/18 1530      PT SHORT TERM GOAL #1   Title  Patient will demonstrate improved pelvic alignment and balance of musculature surrounding the pelvis to facilitate decreased PFM spasms and decrease SIJ and B LE pain as well as decreased pain with vaginal penetration..    Time  5    Period  Weeks    Status  New    Target Date  03/26/18      PT SHORT TERM GOAL #2   Title  Patient will demonstrate a coordinated contraction, relaxation, and bulge of the pelvic floor muscles to demonstrate functional recruitment and motion and allow for further strengthening.    Time  5    Period  Weeks    Status  New    Target Date  03/26/18      PT SHORT TERM GOAL #3   Title  Patient will report a reduction in pain to no greater than 3/10 over the prior week to demonstrate symptom improvement.    Time  5    Period  Weeks    Status  New    Target Date  03/26/18        PT Long Term Goals - 02/19/18 1534      PT LONG TERM GOAL #1   Title  Patient will be able to sit for  up to 2 hours without increased pain in the back or legs to demonstrate improved function.    Baseline  5-10 minutes tolerance    Time  10    Period  Weeks    Status  New    Target Date  04/30/18      PT LONG TERM GOAL #2   Title  Patient will be able to stand or walf for up to 1 hour without increased pain to facilitate improved quality of life and participation in regular walking for overall health.    Baseline  ~20 minutes.    Period  Weeks    Status  New    Target Date  04/30/18      PT LONG TERM GOAL #3   Title  Patient will report no episodes of SUI over the course of the prior two weeks to demonstrate improved functional ability.    Baseline  requiring medication and still having some SUI with cough, sneeze, etc.    Time  10    Period  Weeks    Status  New    Target Date  04/30/18      PT LONG TERM GOAL #4   Title  "Patient will score at or below 8/300 on the PFDI and 0/100 on the PFIQ to demonstrate a clinically meaningful decrease in disability and distress due to pelvic floor dysfunction.    Baseline  PFDI: 53/300, UIQ: 14/100    Time  10    Period  Weeks  Status  New    Target Date  04/30/18             Plan - 02/19/18 1439    Clinical Impression Statement  Pt. is a 62 y/o female who presents today with cheif c/o pain in the low back, hips, and B LE as well as mixed urinary incontinence that began on Jan 15th 2019 following a fall down the stairs. Pt. has had improvement of incontinence with medication but does not like the side-effects (extreme dry-mouth) and continues to have pain that is not well managed with medication. Clinical exam revealed pelvic mal-alignment and leg-length discrepancy, spasm of muscles acting on the pelvis, poor balance and poor sacral mobility indicating SIJ dysfunction. Pt. history of constipation following fall, pain in the SIJ and B LE, prior pain with intercourse, and some SUI in the setting of medication indicates that pelvic muscle  spasm and poor coordination are likely present as well. This will be further assessed in upcoming visits due to time constraint. Patient will benefit from skilled pelvic PT to address the noted deficits and continue to assess for potential contributing factors.    History and Personal Factors relevant to plan of care:  History of Skin Cancer and Cervical dysplasia, report of recent unexplained weight loss (9 lbs in 3 months).    Clinical Presentation  Stable    Clinical Decision Making  Moderate    Rehab Potential  Good    Clinical Impairments Affecting Rehab Potential  sedentary lifestyle    PT Frequency  1x / week    PT Duration  Other (comment) 10 weeks    PT Treatment/Interventions  ADLs/Self Care Home Management;Biofeedback;Electrical Stimulation;Ultrasound;Moist Heat;Traction;Functional mobility training;Therapeutic activities;Therapeutic exercise;Balance training;Patient/family education;Neuromuscular re-education;Manual techniques;Taping;Dry needling    PT Next Visit Plan  Assess PFM and treat R upslip/anterior rotation    Consulted and Agree with Plan of Care  Patient       Patient will benefit from skilled therapeutic intervention in order to improve the following deficits and impairments:  Pain, Improper body mechanics, Decreased coordination, Decreased mobility, Increased muscle spasms, Postural dysfunction, Decreased activity tolerance, Decreased endurance, Decreased range of motion, Decreased strength, Decreased balance, Difficulty walking, Impaired flexibility, Obesity  Visit Diagnosis: Sacrococcygeal disorders, not elsewhere classified  Myalgia  Other muscle spasm  Abnormal posture  Mixed incontinence     Problem List Patient Active Problem List   Diagnosis Date Noted  . Major depressive disorder, recurrent, severe without psychotic features ([place]) 06/28/2015  . PTSD (post-traumatic stress disorder) 06/28/2015  . Grief 06/28/2015   Willa Rough DPT, ATC Willa Rough 02/19/2018, 3:46 PM  [place] MAIN [facility] SERVICES [address] [place], [place], [address] Phone: [phone]   Fax:  [phone]  Name: MARYLIN LATHON MRN: [id number] Date of Birth: 03-03-1956

## 2018-02-21 ENCOUNTER — Encounter: Payer: Self-pay | Admitting: Urology

## 2018-02-26 ENCOUNTER — Ambulatory Visit: Payer: Medicare Other

## 2018-02-26 DIAGNOSIS — M791 Myalgia, unspecified site: Secondary | ICD-10-CM

## 2018-02-26 DIAGNOSIS — M533 Sacrococcygeal disorders, not elsewhere classified: Secondary | ICD-10-CM | POA: Diagnosis not present

## 2018-02-26 DIAGNOSIS — N3946 Mixed incontinence: Secondary | ICD-10-CM

## 2018-02-26 DIAGNOSIS — M62838 Other muscle spasm: Secondary | ICD-10-CM

## 2018-02-26 DIAGNOSIS — R293 Abnormal posture: Secondary | ICD-10-CM

## 2018-02-26 NOTE — Therapy (Signed)
[place] MAIN [facility] SERVICES [address] [place], [place], [address] Phone: [phone]   Fax:  [phone]  Physical Therapy Treatment  Patient Details  Name: Taylor Thompson MRN: [id number] Date of Birth: 1956-01-06 Referring Provider: Zara Council   Encounter Date: 02/26/2018  PT End of Session - 02/26/18 1018    Visit Number  2    Number of Visits  10    Date for PT Re-Evaluation  04/30/18    Authorization Type  Medicare    Authorization - Visit Number  2    Authorization - Number of Visits  10    PT Start Time  0820    PT Stop Time  0930    PT Time Calculation (min)  70 min    Activity Tolerance  Patient tolerated treatment well;No increased pain    Behavior During Therapy  WFL for tasks assessed/performed       Past Medical History:  Diagnosis Date  . Acid reflux   . Anxiety   . Arthritis   . Depression   . Diabetes mellitus without complication ([place])   . Hyperlipidemia   . Hypothyroidism   . Skin cancer   . Sleep apnea   . Thyroid disease     Past Surgical History:  Procedure Laterality Date  . ABDOMINAL HYSTERECTOMY  1986   Vaginal  . CHOLECYSTECTOMY  2009  . SQUAMOUS CELL CARCINOMA EXCISION  2014    There were no vitals filed for this visit.    Pelvic Floor Physical Therapy Treatment Note  SCREENING  Changes in medications, allergies, or medical history?:no     SUBJECTIVE  Patient reports: No change in Sx. Since last visit, no pain today.  Patient Goals: To get rid of LBP and leg pain and come off of bladder medication if possible.    OBJECTIVE  Changes in: Posture/Observations:  R upslip and rotation at beginning of session, balanced at the end of the session following manual treatment.    INTERVENTIONS THIS SESSION: Manual: Performed TP release to R psoas, obliques, and QL followed by QL hold-relax lengthening, long-hold, and quick pull and then MET for R anterior rotation to  improve pelvic alignment and decrease back and leg pain as well as prepare to address PFM specific dysfunction.  Self-care: Educated on moving in a balanced fashion and avoiding SLS, crossed legs, side-sitting as well as drinking extra water to help flush muscles following deep tissue work today. Educated on gentle thoracic rotations and general principles of stretching to help maintain mobility of the spine. NM re-ed: educated on diaphragmatic breathing in hook-lying and how it acts upon her nervous system to help decrease muscular tension with and without TP release.  Total time: 70 min.                         PT Education - 02/26/18 1016    Education provided  Yes    Education Details  See Interventions this session.    Person(s) Educated  Patient    Methods  Explanation;Demonstration;Verbal cues    Comprehension  Verbalized understanding;Returned demonstration       PT Short Term Goals - 02/19/18 1530      PT SHORT TERM GOAL #1   Title  Patient will demonstrate improved pelvic alignment and balance of musculature surrounding the pelvis to facilitate decreased PFM spasms and decrease SIJ and B LE pain as well as decreased pain with vaginal  penetration..    Time  5    Period  Weeks    Status  New    Target Date  03/26/18      PT SHORT TERM GOAL #2   Title  Patient will demonstrate a coordinated contraction, relaxation, and bulge of the pelvic floor muscles to demonstrate functional recruitment and motion and allow for further strengthening.    Time  5    Period  Weeks    Status  New    Target Date  03/26/18      PT SHORT TERM GOAL #3   Title  Patient will report a reduction in pain to no greater than 3/10 over the prior week to demonstrate symptom improvement.    Time  5    Period  Weeks    Status  New    Target Date  03/26/18        PT Long Term Goals - 02/19/18 1534      PT LONG TERM GOAL #1   Title  Patient will be able to sit for up to 2 hours  without increased pain in the back or legs to demonstrate improved function.    Baseline  5-10 minutes tolerance    Time  10    Period  Weeks    Status  New    Target Date  04/30/18      PT LONG TERM GOAL #2   Title  Patient will be able to stand or walf for up to 1 hour without increased pain to facilitate improved quality of life and participation in regular walking for overall health.    Baseline  ~20 minutes.    Period  Weeks    Status  New    Target Date  04/30/18      PT LONG TERM GOAL #3   Title  Patient will report no episodes of SUI over the course of the prior two weeks to demonstrate improved functional ability.    Baseline  requiring medication and still having some SUI with cough, sneeze, etc.    Time  10    Period  Weeks    Status  New    Target Date  04/30/18      PT LONG TERM GOAL #4   Title  "Patient will score at or below 8/300 on the PFDI and 0/100 on the PFIQ to demonstrate a clinically meaningful decrease in disability and distress due to pelvic floor dysfunction.    Baseline  PFDI: 53/300, UIQ: 14/100    Time  10    Period  Weeks    Status  New    Target Date  04/30/18            Plan - 02/26/18 1018    Clinical Impression Statement  Pt. responded slowly but well to TP release today and demonstrated improved pelvic alignment following manual techniques to correct for R upsip and anterior rotation today. Sh edemonstrated understanding of all education provided asnd had no pain at end of session. Continue per POC.    Clinical Presentation  Stable    Clinical Decision Making  Moderate    Rehab Potential  Good    Clinical Impairments Affecting Rehab Potential  sedentary lifestyle    PT Frequency  1x / week    PT Duration  Other (comment) 10 weeks    PT Treatment/Interventions  ADLs/Self Care Home Management;Biofeedback;Electrical Stimulation;Ultrasound;Moist Heat;Traction;Functional mobility training;Therapeutic activities;Therapeutic exercise;Balance  training;Patient/family education;Neuromuscular re-education;Manual techniques;Taping;Dry needling  PT Next Visit Plan  Assess PFM and treat R upslip/anterior rotation    Consulted and Agree with Plan of Care  Patient       Patient will benefit from skilled therapeutic intervention in order to improve the following deficits and impairments:  Pain, Improper body mechanics, Decreased coordination, Decreased mobility, Increased muscle spasms, Postural dysfunction, Decreased activity tolerance, Decreased endurance, Decreased range of motion, Decreased strength, Decreased balance, Difficulty walking, Impaired flexibility, Obesity  Visit Diagnosis: Sacrococcygeal disorders, not elsewhere classified  Myalgia  Other muscle spasm  Abnormal posture  Mixed incontinence     Problem List Patient Active Problem List   Diagnosis Date Noted  . Major depressive disorder, recurrent, severe without psychotic features ([place]) 06/28/2015  . PTSD (post-traumatic stress disorder) 06/28/2015  . Grief 06/28/2015   Willa Rough DPT, ATC Willa Rough 02/26/2018, 10:23 AM  [place] MAIN [facility] SERVICES [address] [place], [place], [address] Phone: [phone]   Fax:  [phone]  Name: Taylor Thompson MRN: [id number] Date of Birth: 1956-06-10

## 2018-03-08 ENCOUNTER — Ambulatory Visit: Payer: Self-pay | Admitting: Urology

## 2018-03-12 ENCOUNTER — Ambulatory Visit: Payer: Medicare Other

## 2018-03-12 DIAGNOSIS — M791 Myalgia, unspecified site: Secondary | ICD-10-CM

## 2018-03-12 DIAGNOSIS — N3946 Mixed incontinence: Secondary | ICD-10-CM

## 2018-03-12 DIAGNOSIS — M533 Sacrococcygeal disorders, not elsewhere classified: Secondary | ICD-10-CM

## 2018-03-12 DIAGNOSIS — R293 Abnormal posture: Secondary | ICD-10-CM

## 2018-03-12 DIAGNOSIS — M62838 Other muscle spasm: Secondary | ICD-10-CM

## 2018-03-12 NOTE — Therapy (Signed)
[place] MAIN [facility] SERVICES [address] [place], [place], [address] Phone: [phone]   Fax:  [phone]  Physical Therapy Treatment  Patient Details  Name: Taylor Thompson MRN: [id number] Date of Birth: June 09, 1956 Referring Provider: Zara Council   Encounter Date: 03/12/2018  PT End of Session - 03/12/18 1551    Visit Number  3    Number of Visits  10    Date for PT Re-Evaluation  04/30/18    Authorization Type  Medicare    Authorization - Visit Number  3    Authorization - Number of Visits  10    PT Start Time  5956    PT Stop Time  0935    PT Time Calculation (min)  60 min    Activity Tolerance  Patient tolerated treatment well;No increased pain    Behavior During Therapy  WFL for tasks assessed/performed       Past Medical History:  Diagnosis Date  . Acid reflux   . Anxiety   . Arthritis   . Depression   . Diabetes mellitus without complication ([place])   . Hyperlipidemia   . Hypothyroidism   . Skin cancer   . Sleep apnea   . Thyroid disease     Past Surgical History:  Procedure Laterality Date  . ABDOMINAL HYSTERECTOMY  1986   Vaginal  . CHOLECYSTECTOMY  2009  . SQUAMOUS CELL CARCINOMA EXCISION  2014    There were no vitals filed for this visit.    Pelvic Floor Physical Therapy Treatment Note  SCREENING  Changes in medications, allergies, or medical history?: no    SUBJECTIVE  Patient reports: She is doing pretty well, has only had pain one day over past 2 weeks and it was only for a few hours, started in the low back then moved into upper back.   Pain update:  Location of pain: R low back/hip Current pain:  1/10  Max pain:  8/10 Least pain:  8/10 Nature of pain: achy  Patient Goals: To get rid of LBP and leg pain and come off of bladder medication if possible. Has been doing better with bladder symptoms, feels that she can hold it it to get to the bathroom successfully.  Had her back  start hurting one other time waiting in line at [facility] as well.  OBJECTIVE  Changes in: Posture/Observations:  Hyperkyphotic posture, butt "clenching".  Range of Motion/Flexibilty:  Decreased scapular retraction/depression ROM limited by tension anteriorly.  Abdominal:  Difficulty with diaphragmatic breathing in standing>sitting>supine.  Able to achieve with moderate amount of coaching.  INTERVENTIONS THIS SESSION: NM re-ed: Educated on and practiced pelvic tilts in seated and diaphragmatic breathing from supine to sit to improve coordination of muscles acting upon the pelvis as well as improve lumbar mobility and strength to improve stability. Therex: Educated on and practiced shoulder retractions and chin-tucks to improve posture and strengthen into improved position to allow for optimal pelvic position for PFM strengthening and relaxation.  Theract: reviewed supine-to-sit and provided handout to decrease intra-abdominal pressure and low back discomfort.  Total time: 60 min.                         PT Education - 03/12/18 1550    Education provided  Yes    Education Details  See Pt. Instructions and Interventions this session    Person(s) Educated  Patient    Methods  Explanation;Demonstration;Tactile cues;Verbal cues;Handout  Comprehension  Verbalized understanding;Returned demonstration;Verbal cues required;Need further instruction       PT Short Term Goals - 02/19/18 1530      PT SHORT TERM GOAL #1   Title  Patient will demonstrate improved pelvic alignment and balance of musculature surrounding the pelvis to facilitate decreased PFM spasms and decrease SIJ and B LE pain as well as decreased pain with vaginal penetration..    Time  5    Period  Weeks    Status  New    Target Date  03/26/18      PT SHORT TERM GOAL #2   Title  Patient will demonstrate a coordinated contraction, relaxation, and bulge of the pelvic floor muscles to demonstrate  functional recruitment and motion and allow for further strengthening.    Time  5    Period  Weeks    Status  New    Target Date  03/26/18      PT SHORT TERM GOAL #3   Title  Patient will report a reduction in pain to no greater than 3/10 over the prior week to demonstrate symptom improvement.    Time  5    Period  Weeks    Status  New    Target Date  03/26/18        PT Long Term Goals - 02/19/18 1534      PT LONG TERM GOAL #1   Title  Patient will be able to sit for up to 2 hours without increased pain in the back or legs to demonstrate improved function.    Baseline  5-10 minutes tolerance    Time  10    Period  Weeks    Status  New    Target Date  04/30/18      PT LONG TERM GOAL #2   Title  Patient will be able to stand or walf for up to 1 hour without increased pain to facilitate improved quality of life and participation in regular walking for overall health.    Baseline  ~20 minutes.    Period  Weeks    Status  New    Target Date  04/30/18      PT LONG TERM GOAL #3   Title  Patient will report no episodes of SUI over the course of the prior two weeks to demonstrate improved functional ability.    Baseline  requiring medication and still having some SUI with cough, sneeze, etc.    Time  10    Period  Weeks    Status  New    Target Date  04/30/18      PT LONG TERM GOAL #4   Title  "Patient will score at or below 8/300 on the PFDI and 0/100 on the PFIQ to demonstrate a clinically meaningful decrease in disability and distress due to pelvic floor dysfunction.    Baseline  PFDI: 53/300, UIQ: 14/100    Time  10    Period  Weeks    Status  New    Target Date  04/30/18            Plan - 03/12/18 1551    Clinical Impression Statement  Pt. responded well to all interventions today, demonstrating improved coordination, ability to perform diaphragmatic breathing, and understanding about exercises for her HEP. Continue per POC.    Clinical Presentation  Stable     Clinical Decision Making  Moderate    Rehab Potential  Good    Clinical Impairments  Affecting Rehab Potential  sedentary lifestyle    PT Frequency  1x / week    PT Duration  Other (comment)    PT Treatment/Interventions  ADLs/Self Care Home Management;Biofeedback;Electrical Stimulation;Ultrasound;Moist Heat;Traction;Functional mobility training;Therapeutic activities;Therapeutic exercise;Balance training;Patient/family education;Neuromuscular re-education;Manual techniques;Taping;Dry needling    PT Next Visit Plan  Assess PFM, re-assess for upslip/rotation, analyze gait, add TA in Quadruped    Consulted and Agree with Plan of Care  Patient       Patient will benefit from skilled therapeutic intervention in order to improve the following deficits and impairments:  Pain, Improper body mechanics, Decreased coordination, Decreased mobility, Increased muscle spasms, Postural dysfunction, Decreased activity tolerance, Decreased endurance, Decreased range of motion, Decreased strength, Decreased balance, Difficulty walking, Impaired flexibility, Obesity  Visit Diagnosis: Sacrococcygeal disorders, not elsewhere classified  Myalgia  Other muscle spasm  Abnormal posture  Mixed incontinence     Problem List Patient Active Problem List   Diagnosis Date Noted  . Major depressive disorder, recurrent, severe without psychotic features ([place]) 06/28/2015  . PTSD (post-traumatic stress disorder) 06/28/2015  . Grief 06/28/2015   Willa Rough DPT, ATC Willa Rough 03/12/2018, 3:55 PM  [place] MAIN [facility] SERVICES [address] [place], [place], [address] Phone: [phone]   Fax:  [phone]  Name: MIASIA CRABTREE MRN: [id number] Date of Birth: 1956-10-27

## 2018-03-12 NOTE — Patient Instructions (Signed)
   Breathe in and push your bottom back, (A) then breathe out as you tuck your tail under (B) repeat for 2 sets of 15 breaths. Do this 1-2 times per day or if back pain increases.     Place foam roller or towel under your upper back between your shoulder blades. Support your head with your hands, elbows forward, and gently rock back and forth and side to side to improve motion in your back.   Move the foam roller or towel up and down to a few spots in the upper back, repeating the process.    Shoulder Retraction and Downward Rotation   Rotate the shoulder blades back and down as if you had to hold a pencil between them, holding for 1 full second each time. Repeat this 10x2__ times _1-2_ times per day.      Start with Shoulder Retraction and Downward Rotation pictures above, holding the position while pulling the chin straight back as if trying to make a "double chin".  Breathe in forward and breathe out as you pull back, repeating this _10x2__ times __1-2__ times per day.  Getting In/out of bed     Lying on back, bend left knee and place left arm across chest. Roll all in one movement to the right. Reverse to roll to the left. Always move as one unit.     Once you are lying on you side, move legs to edge of bed. Pull in the pelvic floor and lower tummy and push down with both hands while moving legs off bed to reach sitting position.  *Reverse sequence to return to lying down.  Copyright  VHI. All rights reserved.

## 2018-03-15 ENCOUNTER — Ambulatory Visit: Payer: Self-pay | Admitting: Urology

## 2018-03-25 ENCOUNTER — Ambulatory Visit: Payer: Medicare Other

## 2018-03-29 ENCOUNTER — Ambulatory Visit: Payer: Medicare Other

## 2018-04-02 ENCOUNTER — Ambulatory Visit: Payer: Medicare Other

## 2018-04-09 ENCOUNTER — Ambulatory Visit: Payer: Medicare Other | Attending: Urology

## 2018-04-09 DIAGNOSIS — N3946 Mixed incontinence: Secondary | ICD-10-CM

## 2018-04-09 DIAGNOSIS — R293 Abnormal posture: Secondary | ICD-10-CM

## 2018-04-09 DIAGNOSIS — M533 Sacrococcygeal disorders, not elsewhere classified: Secondary | ICD-10-CM | POA: Diagnosis not present

## 2018-04-09 DIAGNOSIS — M62838 Other muscle spasm: Secondary | ICD-10-CM

## 2018-04-09 DIAGNOSIS — M791 Myalgia, unspecified site: Secondary | ICD-10-CM | POA: Diagnosis present

## 2018-04-09 NOTE — Patient Instructions (Signed)
Stabilization: Diaphragmatic Breathing    Lie with knees bent, feet flat. Place one hand on stomach, other on chest. Breathe deeply through nose, lifting belly hand without any motion of hand on chest. Repeat __20x2__ times per set. Do _1-2 per day.     Place foam roller or towel under your upper back between your shoulder blades. Support your head with your hands, elbows forward, and gently rock back and forth and side to side to improve motion in your back.   Move the foam roller or towel up and down to a few spots in the upper back, repeating the process.    Shoulder Retraction and Downward Rotation   Rotate the shoulder blades back and down as if you had to hold a pencil between them, holding for 1 full second each time. Repeat this _10x3_ times _1-2_ times per day.      Start with Shoulder Retraction and Downward Rotation pictures above, holding the position while pulling the chin straight back as if trying to make a "double chin".  Breathe in forward and breathe out as you pull back, repeating this _10x3__ times __1-2__ times per day.    Hold stretch for 30 seconds (~5 deep breaths) repeat 2-3 times for each side. Do this 2-3 times per day.

## 2018-04-09 NOTE — Therapy (Signed)
[place] MAIN [facility] SERVICES [address] [place], [place], [address] Phone: [phone]   Fax:  [phone]  Physical Therapy Treatment  Patient Details  Name: Taylor Thompson MRN: [id number] Date of Birth: 1956-08-29 Referring Provider: Zara Council   Encounter Date: 04/09/2018  PT End of Session - 04/09/18 0953    Visit Number  4    Number of Visits  10    Date for PT Re-Evaluation  04/30/18    Authorization Type  Medicare    Authorization - Visit Number  4    Authorization - Number of Visits  10    PT Start Time  0827    PT Stop Time  0927    PT Time Calculation (min)  60 min    Activity Tolerance  Patient tolerated treatment well;No increased pain    Behavior During Therapy  WFL for tasks assessed/performed       Past Medical History:  Diagnosis Date  . Acid reflux   . Anxiety   . Arthritis   . Depression   . Diabetes mellitus without complication ([place])   . Hyperlipidemia   . Hypothyroidism   . Skin cancer   . Sleep apnea   . Thyroid disease     Past Surgical History:  Procedure Laterality Date  . ABDOMINAL HYSTERECTOMY  1986   Vaginal  . CHOLECYSTECTOMY  2009  . SQUAMOUS CELL CARCINOMA EXCISION  2014    There were no vitals filed for this visit.    Pelvic Floor Physical Therapy Treatment Note  SCREENING  Changes in medications, allergies, or medical history?: no   SUBJECTIVE  Patient reports: Had one episode of leakage a couple of weeks ago because she waited too long to go to the bathroom and it was just a little bit. She has not had a return of LBP or leg pain but is having some mid-back pain. Had shingles at ~ T 10 level in the past.   Pain update:  Location of pain: mid back Current pain:  0/10  Max pain: 5/10 Least pain:  0/10 Nature of pain: achy  Patient Goals: To get rid of LBP and leg pain and come off of bladder medication if possible.    OBJECTIVE  Changes  in: Posture/Observations:  Forward neck and shoulders.   Range of Motion/Flexibilty:  Decreased thoracic mobility in spine and through ribs Tightness in deep neck extensors, upper trapezius, and pectoral muscles decreasing extension ROM and reducing neural flow to diaphragm and abdomen.    INTERVENTIONS THIS SESSION: Manual: Performed grade 3-4 mobs to thoracic spine and posterior costovertebral joints for improved mobility, decreased pain, and improved neural flow to abdomen for better breathing coordination. Therex: educated on and practiced thoracic extensions with towel roll, scapular retractions, chin tucks, and pec stretch in a door frame to improve scapular positioning and strengthen extensors for improved posture and long-term symptom relief. Patient practcved diaphragmatic breathing for reinforcement of coordination and to help carry skill over from supine to more functional position in seated.  Total time: 60 min.                            PT Education - 04/09/18 0953    Education provided  Yes    Education Details  See Pt. Instructions and Interventions this session.     Person(s) Educated  Patient    Methods  Explanation;Demonstration;Tactile cues;Verbal cues;Handout  Comprehension  Verbalized understanding;Returned demonstration;Verbal cues required;Tactile cues required;Need further instruction       PT Short Term Goals - 02/19/18 1530      PT SHORT TERM GOAL #1   Title  Patient will demonstrate improved pelvic alignment and balance of musculature surrounding the pelvis to facilitate decreased PFM spasms and decrease SIJ and B LE pain as well as decreased pain with vaginal penetration..    Time  5    Period  Weeks    Status  New    Target Date  03/26/18      PT SHORT TERM GOAL #2   Title  Patient will demonstrate a coordinated contraction, relaxation, and bulge of the pelvic floor muscles to demonstrate functional recruitment and motion and  allow for further strengthening.    Time  5    Period  Weeks    Status  New    Target Date  03/26/18      PT SHORT TERM GOAL #3   Title  Patient will report a reduction in pain to no greater than 3/10 over the prior week to demonstrate symptom improvement.    Time  5    Period  Weeks    Status  New    Target Date  03/26/18        PT Long Term Goals - 02/19/18 1534      PT LONG TERM GOAL #1   Title  Patient will be able to sit for up to 2 hours without increased pain in the back or legs to demonstrate improved function.    Baseline  5-10 minutes tolerance    Time  10    Period  Weeks    Status  New    Target Date  04/30/18      PT LONG TERM GOAL #2   Title  Patient will be able to stand or walf for up to 1 hour without increased pain to facilitate improved quality of life and participation in regular walking for overall health.    Baseline  ~20 minutes.    Period  Weeks    Status  New    Target Date  04/30/18      PT LONG TERM GOAL #3   Title  Patient will report no episodes of SUI over the course of the prior two weeks to demonstrate improved functional ability.    Baseline  requiring medication and still having some SUI with cough, sneeze, etc.    Time  10    Period  Weeks    Status  New    Target Date  04/30/18      PT LONG TERM GOAL #4   Title  "Patient will score at or below 8/300 on the PFDI and 0/100 on the PFIQ to demonstrate a clinically meaningful decrease in disability and distress due to pelvic floor dysfunction.    Baseline  PFDI: 53/300, UIQ: 14/100    Time  10    Period  Weeks    Status  New    Target Date  04/30/18            Plan - 04/09/18 0954    Clinical Impression Statement  Pt. has demonstrated between-session improvement in bladder function and nom return or LBP or leg pain, her cheif complaint today is pain in the mid back occasionally. She has only had one instance of urine leakage with a very full bladder and had minimal leakage. She  responded well to all  interventions today, demonstrating decreased pain and restriction through the thoracic spine and ribs and understanding of all education provided. Continue per POC.    Clinical Presentation  Stable    Clinical Decision Making  Moderate    Clinical Impairments Affecting Rehab Potential  sedentary lifestyle    PT Frequency  1x / week    PT Duration  Other (comment)    PT Treatment/Interventions  ADLs/Self Care Home Management;Biofeedback;Electrical Stimulation;Ultrasound;Moist Heat;Traction;Functional mobility training;Therapeutic activities;Therapeutic exercise;Balance training;Patient/family education;Neuromuscular re-education;Manual techniques;Taping;Dry needling    PT Next Visit Plan  Assess PFM, re-assess for upslip/rotation, analyze gait, add TA in Quadruped    PT Home Exercise Plan  Scap retractions and chin tucks, chest stretch, breathing, thoracic extensions on towel,    Consulted and Agree with Plan of Care  Patient       Patient will benefit from skilled therapeutic intervention in order to improve the following deficits and impairments:  Pain, Improper body mechanics, Decreased coordination, Decreased mobility, Increased muscle spasms, Postural dysfunction, Decreased activity tolerance, Decreased endurance, Decreased range of motion, Decreased strength, Decreased balance, Difficulty walking, Impaired flexibility, Obesity  Visit Diagnosis: Sacrococcygeal disorders, not elsewhere classified  Myalgia  Other muscle spasm  Abnormal posture  Mixed incontinence     Problem List Patient Active Problem List   Diagnosis Date Noted  . Major depressive disorder, recurrent, severe without psychotic features ([place]) 06/28/2015  . PTSD (post-traumatic stress disorder) 06/28/2015  . Grief 06/28/2015   Willa Rough DPT, ATC Willa Rough 04/09/2018, 9:58 AM  [place] MAIN [facility] SERVICES [address] [place],  [place], [address] Phone: [phone]   Fax:  [phone]  Name: BONNEE ZERTUCHE MRN: [id number] Date of Birth: [birth year]

## 2018-04-10 NOTE — ED Provider Notes (Signed)
[facility]    CSN: [id number] Arrival date & time: 11/28/17  1557     History   Chief Complaint Chief Complaint  Patient presents with  . Fall  . Back Pain  . Leg Injury  . Hip Pain    HPI Taylor Thompson is a 62 y.o. female.   62 yo female with a c/o low back pain after slipping and falling down stairs at home 2 days ago. State her left hip and both knees also hurt but just mildly. States her pain and main concern is the low back which is bothering her the most. Denies hitting her head, loss of consciousness, numbness/tingling.  The history is provided by the patient.  Fall   Back Pain  Hip Pain     Past Medical History:  Diagnosis Date  . Acid reflux   . Anxiety   . Arthritis   . Depression   . Diabetes mellitus without complication ([place])   . Hyperlipidemia   . Hypothyroidism   . Skin cancer   . Sleep apnea   . Thyroid disease     Patient Active Problem List   Diagnosis Date Noted  . Major depressive disorder, recurrent, severe without psychotic features ([place]) 06/28/2015  . PTSD (post-traumatic stress disorder) 06/28/2015  . Grief 06/28/2015    Past Surgical History:  Procedure Laterality Date  . ABDOMINAL HYSTERECTOMY  1986   Vaginal  . CHOLECYSTECTOMY  2009  . SQUAMOUS CELL CARCINOMA EXCISION  2014    OB History   None      Home Medications    Prior to Admission medications   Medication Sig Start Date End Date Taking? Authorizing Provider  atorvastatin (LIPITOR) 40 MG tablet  03/21/15  Yes [provider]  clonazePAM (KLONOPIN) 0.5 MG tablet  04/18/15  Yes [provider]  empagliflozin (JARDIANCE) 25 MG TABS tablet Take 25 mg by mouth daily.   Yes [provider]  escitalopram (LEXAPRO) 10 MG tablet  04/18/15  Yes [provider]  famciclovir (FAMVIR) 250 MG tablet Take 1 tablet (250 mg total) by mouth 2 (two) times daily. 06/17/15  Yes Lorin Picket, PA-C  HYDROcodone-acetaminophen  (NORCO/VICODIN) 5-325 MG per tablet Take 1-2 tablets by mouth every 6 (six) hours as needed for severe pain. Patient not taking: Reported on 02/19/2018 06/17/15  Yes Lorin Picket, PA-C  hydrOXYzine (ATARAX/VISTARIL) 50 MG tablet  04/14/15  Yes [provider]  insulin lispro protamine-lispro (HUMALOG 75/25 MIX) (75-25) 100 UNIT/ML SUSP injection Inject into the skin.   Yes [provider]  metformin (FORTAMET) 1000 MG (OSM) 24 hr tablet  02/21/15  Yes [provider]  traZODone (DESYREL) 100 MG tablet  03/30/15  Yes [provider]  ARIPiprazole (ABILIFY) 5 MG tablet  03/21/15   [provider]  cyclobenzaprine (FLEXERIL) 10 MG tablet Take 1 tablet (10 mg total) by mouth at bedtime. Patient not taking: Reported on 02/19/2018 08/04/16   Norval Gable, MD  DULoxetine (CYMBALTA) 30 MG capsule 60 mg.  01/26/18   [provider]  empagliflozin (JARDIANCE) 10 MG TABS tablet Take 10 mg by mouth daily.    [provider]  ESTRACE VAGINAL 0.1 MG/GM vaginal cream  03/08/15   [provider]  Cleda Clarks 100 UNIT/ML KiwkPen  03/30/15   [provider]  ibuprofen (ADVIL,MOTRIN) 800 MG tablet  02/17/15   [provider]  LANTUS SOLOSTAR 100 UNIT/ML Solostar Pen  02/17/15   [provider]  levothyroxine (SYNTHROID, LEVOTHROID) 25 MCG tablet Take 25 mcg by mouth daily before breakfast.    [provider]  methocarbamol (ROBAXIN) 500 MG tablet  02/17/15   [provider]  oxyCODONE-acetaminophen (PERCOCET/ROXICET) 5-325 MG tablet Take 1-2 tablets by mouth every 8 (eight) hours as needed for severe pain. Patient not taking: Reported on 02/08/2018 11/28/17   Norval Gable, MD  sertraline (ZOLOFT) 100 MG tablet Take 100 mg by mouth daily.    [provider]  triamcinolone (KENALOG) 0.1 % paste Use as directed 1 application in the mouth or throat 2 (two) times daily. Patient not taking: Reported on  02/08/2018 06/17/15   Lorin Picket, PA-C  TRULICITY 4.01 UU/7.2ZD [facility]  02/05/18   [provider]    Family History Family History  Problem Relation Age of Onset  . Dementia Mother   . CAD Mother   . Diabetes Mother   . Hypertension Mother   . Osteoarthritis Mother   . Cancer Father   . Cirrhosis Father   . Kidney cancer Neg Hx   . Kidney disease Neg Hx   . Prostate cancer Neg Hx     Social History Social History   Tobacco Use  . Smoking status: Never Smoker  . Smokeless tobacco: Never Used  Substance Use Topics  . Alcohol use: No    Alcohol/week: 0.0 oz  . Drug use: No     Allergies   Codeine and Sulfa antibiotics   Review of Systems Review of Systems  Musculoskeletal: Positive for back pain.     Physical Exam Triage Vital Signs ED Triage Vitals  Enc Vitals Group     BP 11/28/17 1622 133/77     Pulse Rate 11/28/17 1622 91     Resp 11/28/17 1622 16     Temp 11/28/17 1622 98.1 F (36.7 C)     Temp Source 11/28/17 1622 Oral     SpO2 11/28/17 1622 98 %     Weight 11/28/17 1625 198 lb (89.8 kg)     Height 11/28/17 1625 5' (1.524 m)     Head Circumference --      Peak Flow --      Pain Score 11/28/17 1626 6     Pain Loc --      Pain Edu? --      Excl. in [place]? --    No data found.  Updated Vital Signs BP 133/77 (BP Location: Left Arm)   Pulse 91   Temp 98.1 F (36.7 C) (Oral)   Resp 16   Ht 5' (1.524 m)   Wt 198 lb (89.8 kg)   SpO2 98%   BMI 38.67 kg/m   Visual Acuity Right Eye Distance:   Left Eye Distance:   Bilateral Distance:    Right Eye Near:   Left Eye Near:    Bilateral Near:     Physical Exam  Constitutional: She appears well-developed and well-nourished. No distress.  Musculoskeletal:       Left hip: Normal.       Right knee: Normal.       Left knee: Normal.       Lumbar back: She exhibits tenderness, bony tenderness and pain. She exhibits normal range of motion, no swelling, no edema, no deformity, no  laceration, no spasm and normal pulse.  Skin: She is not diaphoretic.  Vitals reviewed.    UC Treatments / Results  Labs (all labs ordered are listed, but only abnormal results  are displayed) Labs Reviewed - No data to display  EKG None  Radiology No results found.  Procedures Procedures (including critical care time)  Medications Ordered in UC Medications - No data to display  Initial Impression / Assessment and Plan / UC Course  I have reviewed the triage vital signs and the nursing notes.  Pertinent labs & imaging results that were available during my care of the patient were reviewed by me and considered in my medical decision making (see chart for details).      Final Clinical Impressions(s) / UC Diagnoses   Final diagnoses:  Pain  Contusion of left hip, initial encounter  Chest wall contusion, left, initial encounter     Discharge Instructions     Rest, ice    ED Prescriptions    Medication Sig Dispense Auth. Provider   oxyCODONE-acetaminophen (PERCOCET/ROXICET) 5-325 MG tablet Take 1-2 tablets by mouth every 8 (eight) hours as needed for severe pain. Patient not taking:  Reported on 02/08/2018 8 tablet Norval Gable, MD     1. x-ray results and diagnosis reviewed with patient 2. rx as per orders above; reviewed possible side effects, interactions, risks and benefits  3. Recommend supportive treatment with rest, ice, otc analgesics prn 4. Follow-up prn if symptoms worsen or don't improve  Controlled Substance Prescriptions [place] Controlled Substance Registry consulted? Yes, I have consulted the [place] Controlled Substances Registry for this patient, and feel the risk/benefit ratio today is favorable for proceeding with this prescription for a controlled substance.   Norval Gable, MD 04/10/18 [phone]

## 2018-04-11 NOTE — Progress Notes (Signed)
04/12/2018 10:20 AM   Taylor Thompson [birth year] [id number]  Referring provider: Zeb Comfort, MD [place] [address] [place], [place] [address]  Chief Complaint  Patient presents with  . Urinary Incontinence    3wk    HPI: 62 yo WF with urge incontinence and vaginal atrophy who presents today for follow up.  Background history Patient is a 24 -year-old Caucasian female who is referred to [place] by [name], MD for urinary incontinence.  Patient states that she has had urinary incontinence since January 15th, 2019 after suffering a fall going down the stairs.   She states she is experiencing urge incontinence several times during the day.   A MRI performed on 12/17/2017 noted no acute or post traumatic finding. No specific cause of the presenting symptoms is identified. Mild non-compressive stenosis at L3-4 and L4-5 as outlined above. The facet arthropathy could be associated with back pain.  She is having associated urinary urgency and nocturia x 1-2.  Patient denies any gross hematuria, dysuria or suprapubic/flank pain.  Patient denies any fevers, chills, nausea or vomiting.   Her PVR is 20 mL.  She does not have incontinence of stool or numbness in her saddle area.   She does not have a history of urinary tract infections, STI's or injury to the bladder.   She does not have a history of nephrolithiasis, GU surgery or GU trauma.   She is not sexually active.   She is post menopausal.   She admits to diarrhea.   She is not having pain with bladder filling.  She is drinking 3 to 4 bottles of water daily.   She is drinking one caffeinated beverages daily.  She seldom drinks soda, but it is usually diet Gingerale.  No juices.  Not drinking alcohol.     At her visit on 02/08/2018, she was started on Myrbertiq 25 mg samples, referred for PT and encouraged to continue to use her vaginal estrogen cream.  She could not tolerate the Myrbetriq as it caused constipation.  She was then given Toviaz  8 mg dialy samples.    Today, (04/12/2018),  the patient has been experiencing urgency x 0-3, frequency x 4-7, not restricting fluids to avoid visits to the restroom, not engaging in toilet mapping, incontinence x 0-3 and nocturia x 0-3 (stable).   Her PVR is 0 mL.   Patient denies any gross hematuria, dysuria or suprapubic/flank pain.  Patient denies any fevers, chills, nausea or vomiting.   She is finding the Toviaz 8 mg daily intolerable as well.  She is finding PT effective.  She is not applying the vaginal estrogen cream three times weekly.   She also mentions that she has had new bruises on her hands that she hasn't seen in the past.  They are not painful.  She is on ASA.     PMH: Past Medical History:  Diagnosis Date  . Acid reflux   . Anxiety   . Arthritis   . Depression   . Diabetes mellitus without complication ([place])   . Hyperlipidemia   . Hypothyroidism   . Skin cancer   . Sleep apnea   . Thyroid disease     Surgical History: Past Surgical History:  Procedure Laterality Date  . ABDOMINAL HYSTERECTOMY  1986   Vaginal  . CHOLECYSTECTOMY  2009  . SQUAMOUS CELL CARCINOMA EXCISION  2014    Home Medications:  Allergies as of 04/12/2018      Reactions  Codeine    Sulfa Antibiotics       Medication List        Accurate as of 04/12/18 10:20 AM. Always use your most recent med list.          ARIPiprazole 5 MG tablet Commonly known as:  ABILIFY   atorvastatin 40 MG tablet Commonly known as:  LIPITOR   clonazePAM 0.5 MG tablet Commonly known as:  KLONOPIN   DULoxetine 30 MG capsule Commonly known as:  CYMBALTA 60 mg.   escitalopram 10 MG tablet Commonly known as:  LEXAPRO   estradiol 0.1 MG/GM vaginal cream Commonly known as:  ESTRACE Apply 0.5 mg with fingertip to just inside the vaginal introitus on Monday, Wednesday and Friday nights   famciclovir 250 MG tablet Commonly known as:  FAMVIR Take 1 tablet (250 mg total) by mouth 2 (two) times daily.     HUMALOG KWIKPEN 100 UNIT/ML KiwkPen Generic drug:  insulin lispro   hydrOXYzine 50 MG tablet Commonly known as:  ATARAX/VISTARIL   ibuprofen 800 MG tablet Commonly known as:  ADVIL,MOTRIN   insulin lispro protamine-lispro (75-25) 100 UNIT/ML Susp injection Commonly known as:  HUMALOG 75/25 MIX Inject into the skin.   JARDIANCE 25 MG Tabs tablet Generic drug:  empagliflozin Take 25 mg by mouth daily.   JARDIANCE 10 MG Tabs tablet Generic drug:  empagliflozin Take 10 mg by mouth daily.   LANTUS SOLOSTAR 100 UNIT/ML Solostar Pen Generic drug:  Insulin Glargine   levothyroxine 25 MCG tablet Commonly known as:  SYNTHROID, LEVOTHROID Take 25 mcg by mouth daily before breakfast.   metformin 1000 MG (OSM) 24 hr tablet Commonly known as:  FORTAMET   methocarbamol 500 MG tablet Commonly known as:  ROBAXIN   ranitidine 150 MG capsule Commonly known as:  ZANTAC   sertraline 100 MG tablet Commonly known as:  ZOLOFT Take 100 mg by mouth daily.   traZODone 100 MG tablet Commonly known as:  DESYREL   triamcinolone 0.1 % paste Commonly known as:  KENALOG Use as directed 1 application in the mouth or throat 2 (two) times daily.   TRULICITY 3.22 GU/5.4YH Sopn Generic drug:  Dulaglutide       Allergies:  Allergies  Allergen Reactions  . Codeine   . Sulfa Antibiotics     Family History: Family History  Problem Relation Age of Onset  . Dementia Mother   . CAD Mother   . Diabetes Mother   . Hypertension Mother   . Osteoarthritis Mother   . Cancer Father   . Cirrhosis Father   . Kidney cancer Neg Hx   . Kidney disease Neg Hx   . Prostate cancer Neg Hx     Social History:  reports that she has never smoked. She has never used smokeless tobacco. She reports that she does not drink alcohol or use drugs.  ROS: UROLOGY Frequent Urination?: No Hard to postpone urination?: No Burning/pain with urination?: No Get up at night to urinate?: Yes Leakage of urine?:  Yes Urine stream starts and stops?: No Trouble starting stream?: No Do you have to strain to urinate?: No Blood in urine?: No Urinary tract infection?: No Sexually transmitted disease?: No Injury to kidneys or bladder?: No Painful intercourse?: Yes Weak stream?: No Currently pregnant?: No Vaginal bleeding?: No Last menstrual period?: n  Gastrointestinal Nausea?: No Vomiting?: No Indigestion/heartburn?: No Diarrhea?: No Constipation?: No  Constitutional Fever: No Night sweats?: No Weight loss?: Yes Fatigue?: No  Skin Skin rash/lesions?: No  Itching?: No  Eyes Blurred vision?: No Double vision?: No  Ears/Nose/Throat Sore throat?: No Sinus problems?: No  Hematologic/Lymphatic Swollen glands?: No Easy bruising?: Yes  Cardiovascular Leg swelling?: No Chest pain?: No  Respiratory Cough?: No Shortness of breath?: No  Endocrine Excessive thirst?: No  Musculoskeletal Back pain?: No Joint pain?: No  Neurological Headaches?: No Dizziness?: No  Psychologic Depression?: No Anxiety?: No  Physical Exam: BP 124/69   Pulse 82   Ht 5' (1.524 m)   Wt 188 lb (85.3 kg)   BMI 36.72 kg/m   Constitutional: Well nourished. Alert and oriented, No acute distress. HEENT: [place] AT, moist mucus membranes. Trachea midline, no masses. Cardiovascular: No clubbing, cyanosis, or edema. Respiratory: Normal respiratory effort, no increased work of breathing. Skin: No rashes, bruises or suspicious lesions. Lymph: No cervical or inguinal adenopathy. Neurologic: Grossly intact, no focal deficits, moving all 4 extremities. Psychiatric: Normal mood and affect.   Laboratory Data: No results found for: WBC, HGB, HCT, MCV, PLT  No results found for: CREATININE  No results found for: PSA  No results found for: TESTOSTERONE  No results found for: HGBA1C  No results found for: TSH  No results found for: CHOL, HDL, CHOLHDL, VLDL, LDLCALC  No results found for: AST No  results found for: ALT No components found for: ALKALINEPHOPHATASE No components found for: BILIRUBINTOTAL  No results found for: ESTRADIOL  Urinalysis    Component Value Date/Time   COLORURINE YELLOW 12/03/2017 [place] 12/03/2017 1644   LABSPEC <1.005 (L) 12/03/2017 1644   PHURINE 5.5 12/03/2017 1644   GLUCOSEU >1000 (A) 12/03/2017 [place] 12/03/2017 [place] 12/03/2017 1644   KETONESUR 40 (A) 12/03/2017 [place] NEGATIVE 12/03/2017 1644   NITRITE NEGATIVE 12/03/2017 1644   LEUKOCYTESUR NEGATIVE 12/03/2017 1644    I have reviewed the labs.   Pertinent Imaging: Results for ZAKIYAH, DIOP (MRN [id number]) as of 04/12/2018 10:11  Ref. Range 04/12/2018 09:45  Scan Result Unknown 49ml    I have independently reviewed the films.    Assessment & Plan:    1. Urge Incontinence Could not tolerated the OAB medications Finding PT helpful - will continue  2. Vaginal atrophy Patient is prescribed the estrace cream to apply Monday, Wednesday and Friday  RTC in three months   3. Bruises Instructed to monitor and if the bruises become more numerous or painful to contact PCP Most likely due to ASA  Return in about 3 months (around 07/13/2018) for OAB questionnaire, PVR and exam.  These notes generated with voice recognition software. I apologize for typographical errors.  Zara Council, PA-C  [facility] [address] [place], [place] [address] [phone]

## 2018-04-12 ENCOUNTER — Ambulatory Visit (INDEPENDENT_AMBULATORY_CARE_PROVIDER_SITE_OTHER): Payer: Medicare Other | Admitting: Urology

## 2018-04-12 ENCOUNTER — Encounter: Payer: Self-pay | Admitting: Urology

## 2018-04-12 VITALS — BP 124/69 | HR 82 | Ht 60.0 in | Wt 188.0 lb

## 2018-04-12 DIAGNOSIS — R233 Spontaneous ecchymoses: Secondary | ICD-10-CM

## 2018-04-12 DIAGNOSIS — R238 Other skin changes: Secondary | ICD-10-CM | POA: Diagnosis not present

## 2018-04-12 DIAGNOSIS — N952 Postmenopausal atrophic vaginitis: Secondary | ICD-10-CM

## 2018-04-12 DIAGNOSIS — N3941 Urge incontinence: Secondary | ICD-10-CM

## 2018-04-12 LAB — BLADDER SCAN AMB NON-IMAGING

## 2018-04-12 MED ORDER — ESTRADIOL 0.1 MG/GM VA CREA: TOPICAL_CREAM | VAGINAL | 12 refills | Status: DC

## 2018-04-13 ENCOUNTER — Other Ambulatory Visit: Payer: Self-pay

## 2018-04-13 ENCOUNTER — Inpatient Hospital Stay: Admission: AD | Admit: 2018-04-13 | Payer: Medicare Other | Source: Intra-hospital | Admitting: Psychiatry

## 2018-04-13 ENCOUNTER — Emergency Department
Admission: EM | Admit: 2018-04-13 | Discharge: 2018-04-13 | Disposition: A | Discharge status: Home / Self Care | Payer: Medicare Other | Attending: Emergency Medicine | Admitting: Emergency Medicine

## 2018-04-13 ENCOUNTER — Encounter: Payer: Self-pay | Admitting: Emergency Medicine

## 2018-04-13 DIAGNOSIS — Z79899 Other long term (current) drug therapy: Secondary | ICD-10-CM | POA: Insufficient documentation

## 2018-04-13 DIAGNOSIS — R82998 Other abnormal findings in urine: Secondary | ICD-10-CM | POA: Diagnosis not present

## 2018-04-13 DIAGNOSIS — E785 Hyperlipidemia, unspecified: Secondary | ICD-10-CM | POA: Diagnosis present

## 2018-04-13 DIAGNOSIS — F4310 Post-traumatic stress disorder, unspecified: Secondary | ICD-10-CM | POA: Diagnosis present

## 2018-04-13 DIAGNOSIS — G4730 Sleep apnea, unspecified: Secondary | ICD-10-CM | POA: Diagnosis present

## 2018-04-13 DIAGNOSIS — E119 Type 2 diabetes mellitus without complications: Secondary | ICD-10-CM

## 2018-04-13 DIAGNOSIS — R42 Dizziness and giddiness: Secondary | ICD-10-CM | POA: Diagnosis not present

## 2018-04-13 DIAGNOSIS — F329 Major depressive disorder, single episode, unspecified: Secondary | ICD-10-CM

## 2018-04-13 DIAGNOSIS — F32A Depression, unspecified: Secondary | ICD-10-CM

## 2018-04-13 DIAGNOSIS — N390 Urinary tract infection, site not specified: Secondary | ICD-10-CM

## 2018-04-13 DIAGNOSIS — F332 Major depressive disorder, recurrent severe without psychotic features: Secondary | ICD-10-CM

## 2018-04-13 LAB — BASIC METABOLIC PANEL
Anion gap: 8 (ref 5–15)
BUN: 6 mg/dL (ref 6–20)
CHLORIDE: 103 mmol/L (ref 101–111)
CO2: 24 mmol/L (ref 22–32)
CREATININE: 0.67 mg/dL (ref 0.44–1.00)
Calcium: 9.5 mg/dL (ref 8.9–10.3)
GFR calc Af Amer: >60 mL/min (ref >60)
Glucose, Bld: 249 mg/dL — ABNORMAL HIGH (ref 65–99)
Potassium: 4 mmol/L (ref 3.5–5.1)
SODIUM: 135 mmol/L (ref 135–145)

## 2018-04-13 LAB — CBC
HCT: 39.3 % (ref 35.0–47.0)
Hemoglobin: 13.5 g/dL (ref 12.0–16.0)
MCH: 30.5 pg (ref 26.0–34.0)
MCHC: 34.4 g/dL (ref 32.0–36.0)
MCV: 88.5 fL (ref 80.0–100.0)
PLATELETS: 184 10*3/uL (ref 150–440)
RBC: 4.44 MIL/uL (ref 3.80–5.20)
RDW: 14.1 % (ref 11.5–14.5)
WBC: 7 10*3/uL (ref 3.6–11.0)

## 2018-04-13 LAB — URINALYSIS, COMPLETE (UACMP) WITH MICROSCOPIC
BACTERIA UA: NONE SEEN
Bilirubin Urine: NEGATIVE
GLUCOSE, UA: NEGATIVE mg/dL
HGB URINE DIPSTICK: NEGATIVE
Ketones, ur: 5 mg/dL — AB
NITRITE: NEGATIVE
PROTEIN: NEGATIVE mg/dL
Specific Gravity, Urine: 1.02 (ref 1.005–1.030)
pH: 5 (ref 5.0–8.0)

## 2018-04-13 MED ORDER — LEVOTHYROXINE SODIUM 50 MCG PO TABS: 25.0000 ug | ORAL_TABLET | Freq: Every day | ORAL | Status: DC

## 2018-04-13 MED ORDER — SODIUM CHLORIDE 0.9 % IV BOLUS
1000.0000 mL | Freq: Once | INTRAVENOUS | Status: AC
  Administered 2018-04-13: 1000 mL via INTRAVENOUS

## 2018-04-13 MED ORDER — CEPHALEXIN 500 MG PO CAPS: 500.0000 mg | ORAL_CAPSULE | Freq: Three times a day (TID) | ORAL | 0 refills | Status: AC

## 2018-04-13 MED ORDER — CEPHALEXIN 500 MG PO CAPS
500.0000 mg | ORAL_CAPSULE | Freq: Once | ORAL | Status: AC
  Administered 2018-04-13: 500 mg via ORAL
  Filled 2018-04-13: qty 1

## 2018-04-13 MED ORDER — HYDROXYZINE HCL 25 MG PO TABS: 50.0000 mg | ORAL_TABLET | Freq: Three times a day (TID) | ORAL | Status: DC | PRN

## 2018-04-13 MED ORDER — ESCITALOPRAM OXALATE 10 MG PO TABS: 20.0000 mg | ORAL_TABLET | Freq: Every day | ORAL | Status: DC

## 2018-04-13 MED ORDER — METFORMIN HCL ER 500 MG PO TB24: 1000.0000 mg | ORAL_TABLET | Freq: Every day | ORAL | Status: DC

## 2018-04-13 MED ORDER — DULOXETINE HCL 60 MG PO CPEP: 60.0000 mg | ORAL_CAPSULE | Freq: Every day | ORAL | Status: DC

## 2018-04-13 MED ORDER — ATORVASTATIN CALCIUM 20 MG PO TABS: 40.0000 mg | ORAL_TABLET | Freq: Every day | ORAL | Status: DC

## 2018-04-13 MED ORDER — CEPHALEXIN 500 MG PO CAPS: 500.0000 mg | ORAL_CAPSULE | Freq: Three times a day (TID) | ORAL | Status: DC

## 2018-04-13 MED ORDER — TRAZODONE HCL 100 MG PO TABS: 100.0000 mg | ORAL_TABLET | Freq: Every day | ORAL | Status: DC

## 2018-04-13 NOTE — Consult Note (Signed)
[facility] Face-to-Face Psychiatry Consult   Reason for Consult:  Suiciidal ideation Referring Physician:  Dr. Iver Nestle Patient Identification: Taylor Thompson MRN:  [id number] Principal Diagnosis: Major depressive disorder, recurrent, severe without psychotic features [facility]) Diagnosis:   Patient Active Problem List   Diagnosis Date Noted  . Major depressive disorder, recurrent, severe without psychotic features ([place]) [F33.2] 06/28/2015    Priority: High  . Sleep apnea [G47.30] 04/13/2018  . Dyslipidemia [E78.5] 04/13/2018  . Diabetes ([place]) [E11.9] 04/13/2018  . UTI (urinary tract infection) [N39.0] 04/13/2018  . PTSD (post-traumatic stress disorder) [F43.10] 06/28/2015  . Grief [F43.21] 06/28/2015    Total Time spent with patient: 1 hour  Identifying data. [name] is a 62 year old female with a history of depression.  Chief complaint. "I felt overwhelmed."  History of present illness. Information was obtained from the patient and the chart. The patient came to the ER reporting worsening of depression and passing suicidal ideation without intention or plan. She also contacted Dr. Nicolasa Ducking, her primary psychiatrist. She has been doing fine on a combination of Cymbalta, Lexapro and Trazodone until recent severe stressors. There is a conflict with her daughter, a raging alcoholic cousin, bipolar brother off medications and a friend dying of cancer. She felt, she could not handle it. In addition, she was duiagnosed with UTI in the ER. After few hours in the ER, talking to her friends, hydrated and on antibiotic, she started feeling better, safe to go home. She denies symptoms of depression, anxiety, psychosis, bipolar mania or substance abuse.  Past psychiatric history. There wer three prior hospitalizations for depression, one after overdose. She is a patient of Dr. Nicolasa Ducking. She has therapist.  Family psychiatric history. Brother with untreated bipolar.  Social history. 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 She has plenty of support from friends.   Risk to Self: Suicidal Ideation: Yes-Currently Present Suicidal Intent: No Is patient at risk for suicide?: Yes Suicidal Plan?: No Access to Means: Yes What has been your use of drugs/alcohol within the last 12 months?: Reports of none How many times?: 3 Other Self Harm Risks: Reports of none Triggers for Past Attempts: Family contact, Other (Comment) Intentional Self Injurious Behavior: None Risk to Others: Homicidal Ideation: No Thoughts of Harm to Others: No Current Homicidal Intent: No Current Homicidal Plan: No Access to Homicidal Means: No Identified Victim: Reports of none History of harm to others?: No Assessment of Violence: None Noted Violent Behavior Description: Reports of none Does patient have access to weapons?: No Criminal Charges Pending?: No Does patient have a court date: No Prior Inpatient Therapy: Prior Inpatient Therapy: Yes Prior Therapy Dates: 2011, 2010 & 2009 Prior Therapy Facilty/Provider(s): [facility], [facility] Reason for Treatment: Depression Prior Outpatient Therapy: Prior Outpatient Therapy: Yes Prior Therapy Dates: Current Prior Therapy Facilty/Provider(s): Dr. Hurman Horn Office) Reason for Treatment: Depression Does patient have an ACCT team?: No Does patient have Intensive In-House Services?  : No Does patient have [facility] services? : No Does patient have P4CC services?: No  Past Medical History:  Past Medical History:  Diagnosis Date  . Acid reflux   . Anxiety   . Arthritis   . Depression   . Diabetes mellitus without complication ([place])   . Hyperlipidemia   . Hypothyroidism   . Skin cancer   . Sleep apnea   . Thyroid disease     Past Surgical History:  Procedure Laterality Date  . ABDOMINAL HYSTERECTOMY  [place]  2009  . SQUAMOUS CELL CARCINOMA EXCISION  2014   Family History:  Family  History  Problem Relation Age of Onset  . Dementia Mother   . CAD Mother   . Diabetes Mother   . Hypertension Mother   . Osteoarthritis Mother   . Cancer Father   . Cirrhosis Father   . Kidney cancer Neg Hx   . Kidney disease Neg Hx   . Prostate cancer Neg Hx    Social History:  Social History   Substance and Sexual Activity  Alcohol Use Yes  . Alcohol/week: 0.0 oz   Comment: rarely     Social History   Substance and Sexual Activity  Drug Use No    Social History   Socioeconomic History  . Marital status: Widowed    Spouse name: Not on file  . Number of children: Not on file  . Years of education: Not on file  . Highest education level: Not on file  Occupational History  . Not on file  Social Needs  . Financial resource strain: Not on file  . Food insecurity:    Worry: Not on file    Inability: Not on file  . Transportation needs:    Medical: Not on file    Non-medical: Not on file  Tobacco Use  . Smoking status: Never Smoker  . Smokeless tobacco: Never Used  Substance and Sexual Activity  . Alcohol use: Yes    Alcohol/week: 0.0 oz    Comment: rarely  . Drug use: No  . Sexual activity: Not Currently    Comment: Had pain  Lifestyle  . Physical activity:    Days per week: Not on file    Minutes per session: Not on file  . Stress: Not on file  Relationships  . Social connections:    Talks on phone: Not on file    Gets together: Not on file    Attends religious service: Not on file    Active member of club or organization: Not on file    Attends meetings of clubs or organizations: Not on file    Relationship status: Not on file  Other Topics Concern  . Not on file  Social History Narrative  . Not on file   Additional Social History:    Allergies:   Allergies  Allergen Reactions  . Codeine   . Sulfa Antibiotics     Labs:  Results for orders placed or performed during the hospital encounter of 04/13/18 (from the past 48 hour(s))   Urinalysis, Complete w Microscopic     Status: Abnormal   Collection Time: 04/13/18 11:48 AM  Result Value Ref Range   Color, Urine YELLOW (A) YELLOW   APPearance CLOUDY (A) CLEAR   Specific Gravity, Urine 1.020 1.005 - 1.030   pH 5.0 5.0 - 8.0   Glucose, UA NEGATIVE NEGATIVE mg/dL   Hgb urine dipstick NEGATIVE NEGATIVE   Bilirubin Urine NEGATIVE NEGATIVE   Ketones, ur 5 (A) NEGATIVE mg/dL   Protein, ur NEGATIVE NEGATIVE mg/dL   Nitrite NEGATIVE NEGATIVE   Leukocytes, UA SMALL (A) NEGATIVE   RBC / HPF 0-5 0 - 5 RBC/hpf   WBC, UA 11-20 0 - 5 WBC/hpf   Bacteria, UA NONE SEEN NONE SEEN   Squamous Epithelial / LPF 11-20 0 - 5   Mucus PRESENT     Comment: Performed at [facility], [address], [place], [place] [address]  Basic metabolic panel     Status: Abnormal   Collection Time: 04/13/18 11:52 AM  Result Value Ref Range   Sodium 135 135 - 145 mmol/L   Potassium 4.0 3.5 - 5.1 mmol/L   Chloride 103 101 - 111 mmol/L   CO2 24 22 - 32 mmol/L   Glucose, Bld 249 (H) 65 - 99 mg/dL   BUN 6 6 - 20 mg/dL   Creatinine, Ser 0.67 0.44 - 1.00 mg/dL   Calcium 9.5 8.9 - 10.3 mg/dL   GFR calc non Af Amer >60 >60 mL/min   GFR calc Af Amer >60 >60 mL/min    Comment: (NOTE) The eGFR has been calculated using the CKD EPI equation. This calculation has not been validated in all clinical situations. eGFR's persistently <60 mL/min signify possible Chronic Kidney Disease.    Anion gap 8 5 - 15    Comment: Performed at [facility], [address] [place], [place] [address]  CBC     Status: None   Collection Time: 04/13/18 11:52 AM  Result Value Ref Range   WBC 7.0 3.6 - 11.0 K/uL   RBC 4.44 3.80 - 5.20 MIL/uL   Hemoglobin 13.5 12.0 - 16.0 g/dL   HCT 39.3 35.0 - 47.0 %   MCV 88.5 80.0 - 100.0 fL   MCH 30.5 26.0 - 34.0 pg   MCHC 34.4 32.0 - 36.0 g/dL   RDW 14.1 11.5 - 14.5 %   Platelets 184 150 - 440 K/uL    Comment: Performed at [facility], [address] [place], [place] [address]    Current Facility-Administered Medications  Medication Dose Route Frequency Provider Last Rate Last Dose  . atorvastatin (LIPITOR) tablet 40 mg  40 mg Oral q1800 Nolia Tschantz B, MD      . cephALEXin (KEFLEX) capsule 500 mg  500 mg Oral Q8H Jermario Kalmar B, MD      . DULoxetine (CYMBALTA) DR capsule 60 mg  60 mg Oral Daily Draden Cottingham B, MD      . escitalopram (LEXAPRO) tablet 20 mg  20 mg Oral Daily Elva Breaker B, MD      . hydrOXYzine (ATARAX/VISTARIL) tablet 50 mg  50 mg Oral TID PRN Israel Wunder B, MD      . Derrill Memo ON 04/14/2018] levothyroxine (SYNTHROID, LEVOTHROID) tablet 25 mcg  25 mcg Oral QAC breakfast Dracen Reigle B, MD      . Derrill Memo ON 04/14/2018] metFORMIN (GLUCOPHAGE-XR) 24 hr tablet 1,000 mg  1,000 mg Oral Q breakfast Jennah Satchell B, MD      . traZODone (DESYREL) tablet 100 mg  100 mg Oral QHS Lenice Koper B, MD       Current Outpatient Medications  Medication Sig Dispense Refill  . DULoxetine (CYMBALTA) 60 MG capsule Take 60 mg by mouth daily.  0  . ARIPiprazole (ABILIFY) 5 MG tablet     . atorvastatin (LIPITOR) 40 MG tablet     . clonazePAM (KLONOPIN) 0.5 MG tablet     . DULoxetine (CYMBALTA) 30 MG capsule 60 mg.     . empagliflozin (JARDIANCE) 10 MG TABS tablet Take 10 mg by mouth daily.    . empagliflozin (JARDIANCE) 25 MG TABS tablet Take 25 mg by mouth daily.    Marland Kitchen escitalopram (LEXAPRO) 10 MG tablet     . estradiol (ESTRACE) 0.1 MG/GM vaginal cream Apply 0.5 mg with fingertip to just inside the vaginal introitus on Monday, Wednesday and Friday nights 30 g  12  . famciclovir (FAMVIR) 250 MG tablet Take 1 tablet (250 mg total) by mouth 2 (two) times daily. 15 tablet 0  . HUMALOG KWIKPEN 100 UNIT/ML KiwkPen     . hydrOXYzine (ATARAX/VISTARIL) 50 MG tablet     . ibuprofen (ADVIL,MOTRIN) 800 MG tablet     . insulin lispro protamine-lispro (HUMALOG 75/25 MIX) (75-25) 100 UNIT/ML SUSP  injection Inject into the skin.    Marland Kitchen LANTUS SOLOSTAR 100 UNIT/ML Solostar Pen     . levothyroxine (SYNTHROID, LEVOTHROID) 25 MCG tablet Take 25 mcg by mouth daily before breakfast.    . metformin (FORTAMET) 1000 MG (OSM) 24 hr tablet     . methocarbamol (ROBAXIN) 500 MG tablet     . ranitidine (ZANTAC) 150 MG capsule Take 150 mg by mouth 2 (two) times daily.     . sertraline (ZOLOFT) 100 MG tablet Take 100 mg by mouth daily.    . traZODone (DESYREL) 100 MG tablet Take 200 mg by mouth at bedtime.     . triamcinolone (KENALOG) 0.1 % paste Use as directed 1 application in the mouth or throat 2 (two) times daily. 5 g 12  . TRULICITY 3.70 DU/4.3CV SOPN       Musculoskeletal: Strength & Muscle Tone: within normal limits Gait & Station: normal Patient leans: N/A  Psychiatric Specialty Exam: Physical Exam  Nursing note and vitals reviewed. Psychiatric: She has a normal mood and affect. Her speech is normal and behavior is normal. Judgment and thought content normal. Cognition and memory are normal.    Review of Systems  Neurological: Negative.   Psychiatric/Behavioral: Negative.   All other systems reviewed and are negative.   Blood pressure 129/62, pulse 73, temperature 98.2 F (36.8 C), temperature source Oral, resp. rate 14, height 5' (1.524 m), weight 84.8 kg (187 lb), SpO2 95 %.Body mass index is 36.52 kg/m.  General Appearance: Casual  Eye Contact:  Good  Speech:  Clear and Coherent  Volume:  Normal  Mood:  Euthymic  Affect:  Appropriate  Thought Process:  Goal Directed and Descriptions of Associations: Intact  Orientation:  Full (Time, Place, and Person)  Thought Content:  WDL  Suicidal Thoughts:  No  Homicidal Thoughts:  No  Memory:  Immediate;   Fair Recent;   Fair Remote;   Fair  Judgement:  Fair  Insight:  Fair  Psychomotor Activity:  Normal  Concentration:  Concentration: Fair and Attention Span: Fair  Recall:  [employer] of Knowledge:  Fair  Language:  Fair   Akathisia:  No  Handed:  Right  AIMS (if indicated):     Assets:  Communication Skills Desire for Improvement Financial Resources/Insurance Housing Physical Health Resilience Social Support Transportation  ADL's:  Intact  Cognition:  WNL  Sleep:        Treatment Plan Summary: Daily contact with patient to assess and evaluate symptoms and progress in treatment and Medication management   PLAN: 1. Please continue all medications as prescribed by Dr. Nicolasa Ducking and keep your appointments with Dr. Nicolasa Ducking and Otila Kluver.  Disposition: No evidence of imminent risk to self or others at present.   Patient does not meet criteria for psychiatric inpatient admission. Supportive therapy provided about ongoing stressors. Discussed crisis plan, support from social network, calling 911, coming to the Emergency Department, and calling Suicide Hotline.  Orson Slick, MD 04/13/2018 6:48 PM

## 2018-04-13 NOTE — ED Provider Notes (Signed)
-----------------------------------------   7:17 PM on 04/13/2018 -----------------------------------------  Patient care assumed from Dr. Burlene Arnt.  Dr. Montel Culver has seen the patient in the emergency department.  Patient does not wish to be admitted to psychiatry any longer.  Patient is here voluntarily.  Psychiatrist believes the patient is safe for discharge home.  We will write for Keflex with outpatient follow-up.   Harvest Dark, MD 04/13/18 [phone]

## 2018-04-13 NOTE — ED Notes (Signed)
Report called to [facility] [phone]. Pt does not need changed out prior to being brought downstairs. Calvin from tts in with pt atm

## 2018-04-13 NOTE — BH Assessment (Signed)
Assessment Note  Taylor Thompson is an 62 y.o. female who presents to the ER due to concerns about being dizzy and difficulty walking. She states, it started when she woke up and attempted to walk down her steps. She further report of having several stressors that are taking place in her life. One being her best friend has stage four brain and lung cancer. "The doctors called the family in because she doesn't have long to live." She also reports her cousin, who is like a sister, recently got into trouble with her boyfriend. The cousin violated a protective ordered and was arrested. Their were several other things that occurred with the cousin that cause the patient to distance herself from her because it was toxic. Patient states she has had a difficult time with it, because they were extremely close. Patient brother currently lives with her, per patient, he suffers with untreated bipolar, and it's negatively affecting her. Patient daughter, who lives in [place], "cussed me out" while she was in the lobby of the ER. This resulted in her getting upset and increased her feelings of hopelessness.   Patient shared she has dealt with depression throughout her life and currently receives outpatient treatment. However, the most recent events have cause her to declined and having a difficult time coping. She states she is having the thoughts of wanting to give up and at time wish, she does not wake up. When asked if she is having SI, she denies it. She explained, "I just feel like giving up and not going on more." Due to her religious beliefs, she does not have any desire or intent to self-harm.  During the interview, the patient was calm, cooperative and pleasant. She was able to provide appropriate answers to the questions. Throughout the assessment she denied SI and endorse increase symptoms of depression and anxiety. Patient says she hasn't cried in years but started crying when the stressors  occurred. She denies the use of mind-altering substances and no involvement with the legal system.  Diagnosis: Depression  Past Medical History:  Past Medical History:  Diagnosis Date  . Acid reflux   . Anxiety   . Arthritis   . Depression   . Diabetes mellitus without complication ([place])   . Hyperlipidemia   . Hypothyroidism   . Skin cancer   . Sleep apnea   . Thyroid disease     Past Surgical History:  Procedure Laterality Date  . ABDOMINAL HYSTERECTOMY  1986   Vaginal  . CHOLECYSTECTOMY  2009  . SQUAMOUS CELL CARCINOMA EXCISION  2014    Family History:  Family History  Problem Relation Age of Onset  . Dementia Mother   . CAD Mother   . Diabetes Mother   . Hypertension Mother   . Osteoarthritis Mother   . Cancer Father   . Cirrhosis Father   . Kidney cancer Neg Hx   . Kidney disease Neg Hx   . Prostate cancer Neg Hx     Social History:  reports that she has never smoked. She has never used smokeless tobacco. She reports that she drinks alcohol. She reports that she does not use drugs.  Additional Social History:  Alcohol / Drug Use Pain Medications: See PTA Prescriptions: See PTA Over the Counter: See PTA History of alcohol / drug use?: No history of alcohol / drug abuse Longest period of sobriety (when/how long): Reports of no use Negative Consequences of Use: (n/a) Withdrawal Symptoms: (n/a)  CIWA: CIWA-Ar BP: Marland Kitchen)  103/59 Pulse Rate: 73 COWS:    Allergies:  Allergies  Allergen Reactions  . Codeine   . Sulfa Antibiotics     Home Medications:  (Not in a hospital admission)  OB/GYN Status:  No LMP recorded. Patient has had a hysterectomy.  General Assessment Data Location of Assessment: [facility] ED TTS Assessment: In system Is this a Tele or Face-to-Face Assessment?: Face-to-Face Is this an Initial Assessment or a Re-assessment for this encounter?: Initial Assessment Marital status: Single Maiden name: n/a Is patient pregnant?: No Living  Arrangements: Other relatives(Brother lives with her) Can pt return to current living arrangement?: Yes Admission Status: Voluntary Is patient capable of signing voluntary admission?: Yes Referral Source: Self/Family/Friend Insurance type: Medicare  Medical Screening Exam ([place]) Medical Exam completed: Yes  Crisis Care Plan Living Arrangements: Other relatives(Brother lives with her) Legal Guardian: Other:(Self) Name of Psychiatrist: Dr. Hurman Horn Office) Name of Therapist: Reports of none  Education Status Is patient currently in school?: No Is the patient employed, unemployed or receiving disability?: Unemployed(Retired)  Risk to self with the past 6 months Suicidal Ideation: Yes-Currently Present Has patient been a risk to self within the past 6 months prior to admission? : Yes Suicidal Intent: No Has patient had any suicidal intent within the past 6 months prior to admission? : No Is patient at risk for suicide?: Yes Suicidal Plan?: No Has patient had any suicidal plan within the past 6 months prior to admission? : No Access to Means: Yes What has been your use of drugs/alcohol within the last 12 months?: Reports of none Previous Attempts/Gestures: Yes How many times?: 3 Other Self Harm Risks: Reports of none Triggers for Past Attempts: Family contact, Other (Comment) Intentional Self Injurious Behavior: None Family Suicide History: Unknown Recent stressful life event(s): Other (Comment), Loss (Comment), Turmoil (Comment), Conflict (Comment) Persecutory voices/beliefs?: No Depression: Yes Depression Symptoms: Insomnia, Tearfulness, Isolating, Fatigue, Guilt, Loss of interest in usual pleasures, Feeling worthless/self pity Substance abuse history and/or treatment for substance abuse?: No Suicide prevention information given to non-admitted patients: Not applicable  Risk to Others within the past 6 months Homicidal Ideation: No Does patient have any  lifetime risk of violence toward others beyond the six months prior to admission? : No Thoughts of Harm to Others: No Current Homicidal Intent: No Current Homicidal Plan: No Access to Homicidal Means: No Identified Victim: Reports of none History of harm to others?: No Assessment of Violence: None Noted Violent Behavior Description: Reports of none Does patient have access to weapons?: No Criminal Charges Pending?: No Does patient have a court date: No Is patient on probation?: No  Psychosis Hallucinations: None noted Delusions: None noted  Mental Status Report Appearance/Hygiene: Unremarkable, In hospital gown Eye Contact: Good Motor Activity: Freedom of movement, Unremarkable Speech: Logical/coherent, Unremarkable Level of Consciousness: Alert Mood: Depressed, Sad, Pleasant Affect: Appropriate to circumstance, Depressed, Sad Anxiety Level: Minimal Thought Processes: Coherent, Relevant Judgement: Unimpaired Orientation: Person, Place, Time, Situation, Appropriate for developmental age Obsessive Compulsive Thoughts/Behaviors: Minimal  Cognitive Functioning Concentration: Normal Memory: Recent Intact, Remote Intact Is patient IDD: No Is patient DD?: No Insight: Fair Impulse Control: Fair Appetite: Good Have you had any weight changes? : No Change Sleep: No Change Total Hours of Sleep: 8 Vegetative Symptoms: None  ADLScreening [facility] Assessment Services) Patient's cognitive ability adequate to safely complete daily activities?: Yes Patient able to express need for assistance with ADLs?: Yes Independently performs ADLs?: Yes (appropriate for developmental age)  Prior Inpatient Therapy Prior Inpatient Therapy:  Yes Prior Therapy Dates: 2011, 2010 & 2009 Prior Therapy Facilty/Provider(s): [facility], [facility] Reason for Treatment: Depression  Prior Outpatient Therapy Prior Outpatient Therapy: Yes Prior Therapy Dates: Current Prior Therapy  Facilty/Provider(s): Dr. Hurman Horn Office) Reason for Treatment: Depression Does patient have an ACCT team?: No Does patient have Intensive In-House Services?  : No Does patient have [facility] services? : No Does patient have P4CC services?: No  ADL Screening (condition at time of admission) Patient's cognitive ability adequate to safely complete daily activities?: Yes Is the patient deaf or have difficulty hearing?: No Does the patient have difficulty seeing, even when wearing glasses/contacts?: No Does the patient have difficulty concentrating, remembering, or making decisions?: No Patient able to express need for assistance with ADLs?: Yes Does the patient have difficulty dressing or bathing?: No Independently performs ADLs?: Yes (appropriate for developmental age) Does the patient have difficulty walking or climbing stairs?: No Weakness of Legs: None Weakness of Arms/Hands: None  Home Assistive Devices/Equipment Home Assistive Devices/Equipment: BIPAP  Therapy Consults (therapy consults require a physician order) PT Evaluation Needed: No OT Evalulation Needed: No SLP Evaluation Needed: No Abuse/Neglect Assessment (Assessment to be complete while patient is alone) Abuse/Neglect Assessment Can Be Completed: Yes Physical Abuse: Denies(As a child) Verbal Abuse: (As a child) Sexual Abuse: (As a child) Exploitation of patient/patient's resources: Denies Self-Neglect: Denies Values / Beliefs Cultural Requests During Hospitalization: None Spiritual Requests During Hospitalization: None Consults Spiritual Care Consult Needed: No Social Work Consult Needed: No         Child/Adolescent Assessment Running Away Risk: Denies(Patient is an adult)  Disposition:  Disposition Initial Assessment Completed for this Encounter: Yes  On Site Evaluation by:   Reviewed with Physician:    Gunnar Fusi MS, LCAS, [place], [employer], CCSI Therapeutic Triage Specialist 04/13/2018 5:03 PM

## 2018-04-13 NOTE — Consult Note (Signed)
Ms. Auzenne no longer wants to come to the hospital voluntarily.  Patient does not meet criteria for IVC. Please discharge as appropriate. Full consult to follow.

## 2018-04-13 NOTE — ED Provider Notes (Signed)
[facility] Emergency Department Provider Note  ____________________________________________   I have reviewed the triage vital signs and the nursing notes. Where available I have reviewed prior notes and, if possible and indicated, outside hospital notes.    HISTORY  Chief Complaint Dizziness    HPI Taylor Thompson is a 62 y.o. female who presents with multiple complaints.  The first is that she has been having bad smelling urine for couple days, and slight low back pain no numbness no weakness no incontinence of bowel or bladder.  The second is that she has had decreased appetite and noticed that her sugars are elevated she is taking she states her diabetes medication as prescribed however, she states that she only had cookies for dinner last night.  In addition, patient states that she woke up and felt somewhat lightheaded for a few minutes but no focal numbness or weakness and feels better in that respect.  Finally, she states she is under a great deal of stress and I did receive a phone call from [name], her psychiatrist who indicated the patient did make statements suggestive of SI.  Patient denies SI to me at this time but states she does need a "break" because she is under a lot of stress.  She has friends with cancer and family members were not appreciative of her help etc.   Past Medical History:  Diagnosis Date  . Acid reflux   . Anxiety   . Arthritis   . Depression   . Diabetes mellitus without complication ([place])   . Hyperlipidemia   . Hypothyroidism   . Skin cancer   . Sleep apnea   . Thyroid disease     Patient Active Problem List   Diagnosis Date Noted  . Major depressive disorder, recurrent, severe without psychotic features ([place]) 06/28/2015  . PTSD (post-traumatic stress disorder) 06/28/2015  . Grief 06/28/2015    Past Surgical History:  Procedure Laterality Date  . ABDOMINAL HYSTERECTOMY  1986   Vaginal  . CHOLECYSTECTOMY   2009  . SQUAMOUS CELL CARCINOMA EXCISION  2014    Prior to Admission medications   Medication Sig Start Date End Date Taking? Authorizing Provider  DULoxetine (CYMBALTA) 60 MG capsule Take 60 mg by mouth daily. 04/12/18  Yes [provider]  ARIPiprazole (ABILIFY) 5 MG tablet  03/21/15   [provider]  atorvastatin (LIPITOR) 40 MG tablet  03/21/15   [provider]  clonazePAM (KLONOPIN) 0.5 MG tablet  04/18/15   [provider]  DULoxetine (CYMBALTA) 30 MG capsule 60 mg.  01/26/18   [provider]  empagliflozin (JARDIANCE) 10 MG TABS tablet Take 10 mg by mouth daily.    [provider]  empagliflozin (JARDIANCE) 25 MG TABS tablet Take 25 mg by mouth daily.    [provider]  escitalopram (LEXAPRO) 10 MG tablet  04/18/15   [provider]  estradiol (ESTRACE) 0.1 MG/GM vaginal cream Apply 0.5 mg with fingertip to just inside the vaginal introitus on Monday, Wednesday and Friday nights 04/12/18   Zara Council A, PA-C  famciclovir (FAMVIR) 250 MG tablet Take 1 tablet (250 mg total) by mouth 2 (two) times daily. 06/17/15   Lorin Picket, PA-C  HUMALOG KWIKPEN 100 UNIT/ML KiwkPen  03/30/15   [provider]  hydrOXYzine (ATARAX/VISTARIL) 50 MG tablet  04/14/15   [provider]  ibuprofen (ADVIL,MOTRIN) 800 MG tablet  02/17/15   [provider]  insulin lispro protamine-lispro (HUMALOG  75/25 MIX) (75-25) 100 UNIT/ML SUSP injection Inject into the skin.    [provider]  LANTUS SOLOSTAR 100 UNIT/ML Solostar Pen  02/17/15   [provider]  levothyroxine (SYNTHROID, LEVOTHROID) 25 MCG tablet Take 25 mcg by mouth daily before breakfast.    [provider]  metformin (FORTAMET) 1000 MG (OSM) 24 hr tablet  02/21/15   [provider]  methocarbamol (ROBAXIN) 500 MG tablet  02/17/15   [provider]  ranitidine (ZANTAC) 150 MG capsule Take 150 mg by mouth 2 (two) times  daily.  03/25/18   [provider]  sertraline (ZOLOFT) 100 MG tablet Take 100 mg by mouth daily.    [provider]  traZODone (DESYREL) 100 MG tablet Take 200 mg by mouth at bedtime.  03/30/15   [provider]  triamcinolone (KENALOG) 0.1 % paste Use as directed 1 application in the mouth or throat 2 (two) times daily. 06/17/15   Lorin Picket, PA-C  TRULICITY 4.08 XK/4.8JE SOPN  02/05/18   [provider]    Allergies Codeine and Sulfa antibiotics  Family History  Problem Relation Age of Onset  . Dementia Mother   . CAD Mother   . Diabetes Mother   . Hypertension Mother   . Osteoarthritis Mother   . Cancer Father   . Cirrhosis Father   . Kidney cancer Neg Hx   . Kidney disease Neg Hx   . Prostate cancer Neg Hx     Social History Social History   Tobacco Use  . Smoking status: Never Smoker  . Smokeless tobacco: Never Used  Substance Use Topics  . Alcohol use: Yes    Alcohol/week: 0.0 oz    Comment: rarely  . Drug use: No    Review of Systems Constitutional: No fever/chills Eyes: No visual changes. ENT: No sore throat. No stiff neck no neck pain Cardiovascular: Denies chest pain. Respiratory: Denies shortness of breath. Gastrointestinal:   no vomiting.  No diarrhea.  No constipation. Genitourinary: Negative for dysuria. Musculoskeletal: Negative lower extremity swelling Skin: Negative for rash. Neurological: Negative for severe headaches, focal weakness or numbness.   ____________________________________________   PHYSICAL EXAM:  VITAL SIGNS: ED Triage Vitals  Enc Vitals Group     BP 04/13/18 1137 120/72     Pulse Rate 04/13/18 1137 76     Resp 04/13/18 1137 14     Temp 04/13/18 1137 98.2 F (36.8 C)     Temp Source 04/13/18 1137 Oral     SpO2 04/13/18 1137 94 %     Weight 04/13/18 1143 187 lb (84.8 kg)     Height 04/13/18 1143 5' (1.524 m)     Head Circumference --      Peak Flow --      Pain Score 04/13/18  1143 0     Pain Loc --      Pain Edu? --      Excl. in [place]? --     Constitutional: Alert and oriented. Well appearing and in no acute distress. Eyes: Conjunctivae are normal Head: Atraumatic HEENT: No congestion/rhinnorhea. Mucous membranes are moist.  Oropharynx non-erythematous Neck:   Nontender with no meningismus, no masses, no stridor Cardiovascular: Normal rate, regular rhythm. Grossly normal heart sounds.  Good peripheral circulation. Respiratory: Normal respiratory effort.  No retractions. Lungs CTAB. Abdominal: Soft and nontender. No distention. No guarding no rebound Back:  There is no focal tenderness or step off.  there is no midline tenderness there  are no lesions noted. there is no CVA tenderness Musculoskeletal: No lower extremity tenderness, no upper extremity tenderness. No joint effusions, no DVT signs strong distal pulses no edema Neurologic:  Normal speech and language. No gross focal neurologic deficits are appreciated.  Skin:  Skin is warm, dry and intact. No rash noted. Psychiatric: Mood and affect are anxious. Speech and behavior are normal.  ____________________________________________   LABS (all labs ordered are listed, but only abnormal results are displayed)  Labs Reviewed  BASIC METABOLIC PANEL - Abnormal; Notable for the following components:      Result Value   Glucose, Bld 249 (*)    All other components within normal limits  URINALYSIS, COMPLETE (UACMP) WITH MICROSCOPIC - Abnormal; Notable for the following components:   Color, Urine YELLOW (*)    APPearance CLOUDY (*)    Ketones, ur 5 (*)    Leukocytes, UA SMALL (*)    All other components within normal limits  URINE CULTURE  CBC    Pertinent labs  results that were available during my care of the patient were reviewed by me and considered in my medical decision making (see chart for details). ____________________________________________  EKG  I personally interpreted any EKGs ordered by  me or triage Sinus rhythm rate 70 bpm no acute ST elevation or depression ossific ST changes no acute ischemia ____________________________________________  RADIOLOGY  Pertinent labs & imaging results that were available during my care of the patient were reviewed by me and considered in my medical decision making (see chart for details). If possible, patient and/or family made aware of any abnormal findings.  No results found. ____________________________________________    PROCEDURES  Procedure(s) performed: None  Procedures  Critical Care performed: None  ____________________________________________   INITIAL IMPRESSION / ASSESSMENT AND PLAN / ED COURSE  Pertinent labs & imaging results that were available during my care of the patient were reviewed by me and considered in my medical decision making (see chart for details).  Here with multiple different complaints.  She does have "foul-smelling urine" and used to have a mild UTI will send urine cultures, certainly no evidence of urosepsis.  We are giving her antibiotics here, and IV fluid otherwise reassuring work-up physically.  Nothing to suggest CVA or other acute pathology and of more concern is the patient's mental status, she has no SI to me but she did call her psychiatrist and told her that t he was overwhelmed and suicidal, family are concerned about this.  Patient is as well.  She is voluntary and would like to be admitted downstairs the room is apparently been made ready for her, we will treat her urinary tract infection with Keflex urine culture is being sent, and patient will be a voluntary admission to the psychiatric ward downstairs.  Reassuring work-up return precautions   ____________________________________________   FINAL CLINICAL IMPRESSION(S) / ED DIAGNOSES  Final diagnoses:  None      This chart was dictated using voice recognition software.  Despite best efforts to proofread,  errors can occur which  can change meaning.      Schuyler Amor, MD 04/13/18 1447

## 2018-04-13 NOTE — ED Triage Notes (Signed)
Pt to ED from home via EMS c/o feeling dizzy when she woke up at 0900 today, went to bed around 1am this morning and felt fine.  Patient A&Ox4, denies n/v/d, (+) sensation, denies numbness or tingling.  States blood sugar at home was 305.  Pt has many stressors at home, tearful in triage.

## 2018-04-13 NOTE — ED Notes (Signed)
Psychiatrist in room with pt to re-eval

## 2018-04-13 NOTE — BH Assessment (Signed)
Patient is to be admitted to [facility] by Dr. Bary Leriche.  Attending Physician will be Dr. Bary Leriche.   Patient has been assigned to room 302, by [place] Nurse [place].   Intake Paper Work has been signed and placed on patient chart.  ER staff is aware of the admission:  [place], ER Sectary   Dr. Joen Laura, ER MD   Manuela Schwartz, Patient's Nurse   Levada Dy, Patient Access.

## 2018-04-13 NOTE — ED Notes (Signed)
Pt states feeling much better after fluids and antibiotic and dinner. Pt is voluntary and would like to be discharged instead of admitted. Pt will call [name] and speak with them about whether or not she can be discharged home.

## 2018-04-14 LAB — URINE CULTURE

## 2018-04-15 ENCOUNTER — Encounter: Payer: Self-pay | Admitting: Urology

## 2018-04-22 ENCOUNTER — Ambulatory Visit: Payer: Medicare Other

## 2018-04-23 ENCOUNTER — Ambulatory Visit: Payer: Medicare Other | Attending: Urology

## 2018-04-23 ENCOUNTER — Ambulatory Visit: Payer: Medicare Other

## 2018-04-23 DIAGNOSIS — M791 Myalgia, unspecified site: Secondary | ICD-10-CM | POA: Diagnosis present

## 2018-04-23 DIAGNOSIS — N3946 Mixed incontinence: Secondary | ICD-10-CM | POA: Diagnosis present

## 2018-04-23 DIAGNOSIS — M62838 Other muscle spasm: Secondary | ICD-10-CM | POA: Diagnosis present

## 2018-04-23 DIAGNOSIS — R293 Abnormal posture: Secondary | ICD-10-CM | POA: Diagnosis present

## 2018-04-23 DIAGNOSIS — M533 Sacrococcygeal disorders, not elsewhere classified: Secondary | ICD-10-CM | POA: Insufficient documentation

## 2018-04-23 NOTE — Patient Instructions (Addendum)
Urge supression technique:  1) Take a deep breath to convince yourself that you are in control and calm the nervous system.  2) Do 5 "quick-flick" kegels (pelvic floor muscle contractions) and re-assess the urge. Repeat another set if urge is still present. 3) Once the urge has decreased, start walking calmly to the the bathroom. Stop and repeat steps 1 and 2 as many times as needed until you can successfully get to the toilet. 4) Only once seated, take a deep breath and allow the pelvic floor muscles to relax and allow for the urine to flow.    Do not be discouraged if you are not successful the first couple times, this is normal and it will take practice but remember that YOU are in control. Start by practicing this at home where you do not have to worry as much if there were to be an accident. Allowing yourself to get rushed or nervous puts the bladder back in control and will not allow the technique to work.   Your Vagina is Not Cussing! One of the most fascinating things I've learned as a pelvic floor physical therapist is that women really have a variety of ways that they wash their crotch. Should that be "crotches"? Can you make that plural? If not, why not? Tell me that.. But, cleaning the crotch - it's important. We clean our face, our armpits and our feet. The crotch has got a lot going on so it should be cleaned too, right? Women clean themselves differently, but that's not necessarily okay. There are some basic facts that are important to know when it comes to keeping your machine well-oiled and running, regardless of whether she's a [place] or a 2015 [employer]; cuz either way she's a beauty, right? So what is the right way for a woman to clean her vulvovaginal area in order to ensure cleanliness, odor reduction and avoidance of infection? Let's start with what I hear from patients: 1. "I usually douche because that's what my mother did." 2. "I use a lavender scented soap  all over my body and I get a wash rag and scrub my vulva." 3. "I spread my labias and get soap on them and then I put soap inside my vagina. I'm very clean." 4. "I'm careful, so I go front to back with the soap. I start at the vulva and soap it real good, then I reach over to my anus and get that soapy." 5. "I use a loofa on my vulva and then after I shower I spray a little perfume down there. You never know what's going to happen that day." Friends, Romans, Countrywomen - lend me your ear! All these people are WRONG! (and that's probably one reason why they're seeing me in the first place) If you want my advice, I'm going to be succinct, clear and direct. You can wash your vulvovaginal area any way you'd like as long as you are in the shower, eliminate all soap and let warm water run over the area and only use your hands. Just call me the Lorene Dy of the vagina.or is that weird?  Here's what I want you to do: 1. Wash your hair. 2. Wash your body with soap. 3. Rinse everything off. 4. Let warm water rinse over your labias. Yes, you can spread your labias. 5. Let warm water rinse over your anus. 6. Get out of the shower.** 7. Gently and lovingly pat the vulva dry and put on  white, cotton underwear. **You can wash your hands before getting out. So why am I so restricting? Here's why: 1. The vagina is self-cleansing. There is no need to douche or soap inside the vagina. It's already got a good bacteria called lactobacilli that has several important functions. Lactobacilli eats up bad bacteria that can cause infection, it keeps the vagina acidic in order to reduce the likelihood of infections and it's even postulated that lactobacilli can prompt the immune system. This helps reduce odor, infection and keeps the natural flora healthy. Oh, and get this - estrogen helps to feed lactobacilli. So if you're low on estrogen, it makes sense that you might be prone to more infections. Please, just don't use  soap on the vulva or in the vagina. Trust me, your vagina is not cussing. (Ironically enough, the inside of the mouth is made up of the same durable tissue as the inside of the vagina.) 2. The vulva wasn't meant to be scrubbed - it's not a potato. The vulva is sensitive like your fingertips, the skin around your eyes and your lips. It's meant to detect fine detail (for pleasure), so being forceful with it is going to make it more sensitive in a negative way - hypersensitive (for pain). Scrubbing can remove a fine layer of the vulvovaginal tissue which can create an anti-histamine response - much like scrubbing your arm would make your arm red. This creates an inflammatory cascade of events. Many people will heal from this quite quickly and may not notice any discomfort, but others may start to notice some irritation after some time. This is when you might start noticing sensitivity to things that never bothered you before like tight clothes, colorful underwear, lubricants or laundry detergent. 3. Scented items (or items with chemicals) like perfume (on the vulva), soap, bubble bath or even flavored or hot/cold/tingly/prickly/naughty sexual lubricant/condoms should be avoided as well because they could irritate the opening of the vagina (the vulvar vestibule) or the vagina itself. The vulvar vestibule is made of up different tissue than the vagina (but the same tissue as the urethra and bladder), so it's possible that using chemical products here can cause pain and the symptoms of a urinary tract infection (UTI). 4. The vagina needs to breathe. Wearing tight, conforming clothing all the time or daily pantyliners can be suffocating to your vulvovaginal area and irritating to the skin. Give it a break sometimes and wear looser clothing and or no underwear at all (like at night). 5. If you have a sensitive vulva or are prone to a lot of symptoms of infections, consider wearing white, cotton underwear instead of the  fancy stuff. Over time, it's possible to develop new allergies and unfortunately, some women develop allergies to synthetic materials and dyes in their underwear. This also means it's best to wash your underwear with a detergent that is made for sensitive skin and is free of chemicals. ** Note - we will expand this area in the near future (with Sara's blessings) to include other options for under wear or safe liners. Stay tuned!  And get this: Discharge doesn't mean you are dirty. Discharge is natural and comes from a variety of places, most of which are not the vagina itself. What you see on your underwear is a mix of oil and gland secretions from the vulva and it's also secretions from the uterus and the fallopian tubes. Discharge changes during different parts of the menstrual cycle because it serves different purposes. For  example, when you are ovulating, the discharge is a different consistency so that sperm can pass through it more easily. It's all normal and healthy. However, if it starts to change colors or smell really funky - this indicates a possible issue with an area that is potentially apart from the vagina. Soaping and scrubbing to high [place]ie [place] is not going to fix this - you really need to get checked out by a doctor in this situation. Taking care of the vulvovaginal tissue is easier than we want it to be. Less is more. So much more. Good, simple vulvovaginal hygiene means better flora (not fauna), reduced odor, less itching and less discomfort. So cheers to you and your polite vagina. That little number was raised right! -Bing Neighbors. Sauder PT, DPT   Self External Trigger Point Relief    1) Wash your hands and prop yourself up in a way where you can easily reach the vagina. You may wish to have a small hand-held mirror near by.  2) Use the 2 middle fingers to put gentle pressure on the three external pelvic floor muscles and hold pressure and take deep breaths as you allow the tension to release  and discomfort to dissipate   3) Repeat the process for any trigger points you find spending between 3-10 minutes on this every 1-2 days until you do not find any more trigger points or you are told otherwise by your therapist.  Self Posterior Fourchette Stretching/Mobilization    1) Wash your hands and prop your body up so you can easily reach the vagina, bring hand-held mirror if desired.  2) Apply lubricant to the thumb and vaginal opening  3) Place thumb ~ 1/2 an inch into the vagina with the pad of the thumb pointed down and apply gentle pressure to the posterior fourchette.  4) Gently sweep the thumb side to side and in/out while maintaining pressure down toward the anus. Make sure the pressure is not so great that your muscles tighten up and guard, just enough to create slight discomfort.  Do this for ~ 3 min. Per night to decrease tightness and tenderness at the vaginal opening.  Stabilization: Diaphragmatic Breathing    Lie with knees bent, feet flat. Place one hand on stomach, other on chest. Breathe deeply through nose, lifting belly hand without any motion of hand on chest. Repeat __20__ times per set. Do __2__ sets per session. Do __7+__ sessions per week.

## 2018-04-23 NOTE — Therapy (Signed)
[place] MAIN [facility] SERVICES [address] [place], [place], [address] Phone: [phone]   Fax:  [phone]  Physical Therapy Treatment  Patient Details  Name: Taylor Thompson MRN: [id number] Date of Birth: 08/30/1956 Referring Provider: Zara Council   Encounter Date: 04/23/2018  PT End of Session - 04/23/18 1536    Visit Number  5    Number of Visits  10    Date for PT Re-Evaluation  04/30/18    Authorization Type  Medicare    Authorization - Visit Number  5    Authorization - Number of Visits  10    PT Start Time  4627    PT Stop Time  1150    PT Time Calculation (min)  70 min    Activity Tolerance  Patient tolerated treatment well;No increased pain    Behavior During Therapy  WFL for tasks assessed/performed       Past Medical History:  Diagnosis Date  . Acid reflux   . Anxiety   . Arthritis   . Depression   . Diabetes mellitus without complication ([place])   . Hyperlipidemia   . Hypothyroidism   . Skin cancer   . Sleep apnea   . Thyroid disease     Past Surgical History:  Procedure Laterality Date  . ABDOMINAL HYSTERECTOMY  1986   Vaginal  . CHOLECYSTECTOMY  2009  . SQUAMOUS CELL CARCINOMA EXCISION  2014    There were no vitals filed for this visit.   Pelvic Floor Physical Therapy Treatment Note  SCREENING  Changes in medications, allergies, or medical history?: no   SUBJECTIVE  Patient reports: Has had leakage 3-4 times over the last week-week and a half but has only had mid back pain once, briefly and was able to decrease by doing her scapular retractions and chin-tucks. Has not been doing extensions over towel, has had no return of low back or leg pain.  Precautions:  Skin Cancer  Pain update:  Location of pain: upper back Current pain:  0/10  Max pain:  5/10 Least pain:  0/10 Nature of pain: continuous  Patient Goals: To get rid of LBP and leg pain and come off of bladder medication if  possible.    OBJECTIVE  Changes in: Posture/Observations:  Less forward shoulders than at prior visit.   Pelvic floor: External Exam:  Introitus Appears: elevated Skin integrity: errythema Palpation: TTP through STP, IC B Cough: paradoxical Prolapse visible?: no Scar mobility: decreased mobility  Internal Vaginal Exam:  Strength (PERF): 4+/5, 6 seconds, 3 times Symmetry: posterior>anterior Palpation: TTP throughout, greatest at posterior fourchette re-creating "her pain" with intercourse Prolapse: visible ~ 2 cm above the level of the introitus with pressure.  INTERVENTIONS THIS SESSION: Manual: Performed internal vaginal assessment and performed TP release to B IC and through posterior fourchette as well as educated on self massage to posterior fourchette and STP TP release to decrease PFM spasm for improved motion and function as well as decreased pain with intercourse and decreased constipation. NM re-ed: educated on PFM contraction, relaxation, and bulge with elevator metaphor and flower imagery to improve lengthening for decreased constipation and pain with intercourse and leakage. Educated on and Practiced diaphragmatic breathing to improve PFM relaxation and length and to down-regulate nervous system to maintain length for ease of BM and decreased SUI and pain with intercourse. Self-care: Educated on vulvar hygiene, squatty potty, and pelvic exam findings to help decrease constipation and pain with intercourse. Educated  on urge-suppression technique to decrease urine leakage.  Total time: 70 min.                           PT Education - 04/23/18 1535    Education provided  Yes    Education Details  See Pt. Instructions and Interventions this session    Person(s) Educated  Patient    Methods  Explanation;Demonstration;Tactile cues;Verbal cues;Handout    Comprehension  Verbalized understanding;Returned demonstration;Verbal cues required;Tactile  cues required;Need further instruction       PT Short Term Goals - 02/19/18 1530      PT SHORT TERM GOAL #1   Title  Patient will demonstrate improved pelvic alignment and balance of musculature surrounding the pelvis to facilitate decreased PFM spasms and decrease SIJ and B LE pain as well as decreased pain with vaginal penetration..    Time  5    Period  Weeks    Status  New    Target Date  03/26/18      PT SHORT TERM GOAL #2   Title  Patient will demonstrate a coordinated contraction, relaxation, and bulge of the pelvic floor muscles to demonstrate functional recruitment and motion and allow for further strengthening.    Time  5    Period  Weeks    Status  New    Target Date  03/26/18      PT SHORT TERM GOAL #3   Title  Patient will report a reduction in pain to no greater than 3/10 over the prior week to demonstrate symptom improvement.    Time  5    Period  Weeks    Status  New    Target Date  03/26/18        PT Long Term Goals - 02/19/18 1534      PT LONG TERM GOAL #1   Title  Patient will be able to sit for up to 2 hours without increased pain in the back or legs to demonstrate improved function.    Baseline  5-10 minutes tolerance    Time  10    Period  Weeks    Status  New    Target Date  04/30/18      PT LONG TERM GOAL #2   Title  Patient will be able to stand or walf for up to 1 hour without increased pain to facilitate improved quality of life and participation in regular walking for overall health.    Baseline  ~20 minutes.    Period  Weeks    Status  New    Target Date  04/30/18      PT LONG TERM GOAL #3   Title  Patient will report no episodes of SUI over the course of the prior two weeks to demonstrate improved functional ability.    Baseline  requiring medication and still having some SUI with cough, sneeze, etc.    Time  10    Period  Weeks    Status  New    Target Date  04/30/18      PT LONG TERM GOAL #4   Title  "Patient will score at or  below 8/300 on the PFDI and 0/100 on the PFIQ to demonstrate a clinically meaningful decrease in disability and distress due to pelvic floor dysfunction.    Baseline  PFDI: 53/300, UIQ: 14/100    Time  10    Period  Weeks    Status  New    Target Date  04/30/18            Plan - 04/23/18 1537    Clinical Impression Statement  Pt. responded well to all interventions today, demonstrating decreased PFM spasm, improved coordination, and understanding of all education provided. Continue per POC.    Clinical Presentation  Stable    Clinical Decision Making  Moderate    Rehab Potential  Good    Clinical Impairments Affecting Rehab Potential  sedentary lifestyle    PT Frequency  1x / week    PT Duration  Other (comment) 10 weeks    PT Treatment/Interventions  ADLs/Self Care Home Management;Biofeedback;Electrical Stimulation;Ultrasound;Moist Heat;Traction;Functional mobility training;Therapeutic activities;Therapeutic exercise;Balance training;Patient/family education;Neuromuscular re-education;Manual techniques;Taping;Dry needling    PT Next Visit Plan  re-assess for upslip/rotation, analyze gait, add TA in Quadruped Add 3-way wall stretch or alternative, educate on coconut oil suppositories,     PT Home Exercise Plan  Scap retractions and chin tucks, chest stretch, breathing, thoracic extensions on towel, self external TP release, self posterior fourchette release. urge suppression, breathing    Consulted and Agree with Plan of Care  Patient       Patient will benefit from skilled therapeutic intervention in order to improve the following deficits and impairments:  Pain, Improper body mechanics, Decreased coordination, Decreased mobility, Increased muscle spasms, Postural dysfunction, Decreased activity tolerance, Decreased endurance, Decreased range of motion, Decreased strength, Decreased balance, Difficulty walking, Impaired flexibility, Obesity  Visit Diagnosis: Sacrococcygeal disorders,  not elsewhere classified  Myalgia  Other muscle spasm  Abnormal posture  Mixed incontinence     Problem List Patient Active Problem List   Diagnosis Date Noted  . Sleep apnea 04/13/2018  . Dyslipidemia 04/13/2018  . Diabetes ([place]) 04/13/2018  . UTI (urinary tract infection) 04/13/2018  . Major depressive disorder, recurrent, severe without psychotic features ([place]) 06/28/2015  . PTSD (post-traumatic stress disorder) 06/28/2015  . Grief 06/28/2015   Willa Rough DPT, ATC Willa Rough 04/23/2018, 3:47 PM  [place] MAIN [facility] SERVICES [address] [place], [place], [address] Phone: [phone]   Fax:  [phone]  Name: Taylor Thompson MRN: [id number] Date of Birth: 29-Apr-1956

## 2018-05-08 ENCOUNTER — Ambulatory Visit: Payer: Medicare Other

## 2018-05-23 ENCOUNTER — Encounter: Payer: Self-pay | Admitting: *Deleted

## 2018-05-24 ENCOUNTER — Ambulatory Visit: Payer: Medicare Other | Admitting: Anesthesiology

## 2018-05-24 ENCOUNTER — Encounter
Admission: RE | Disposition: A | Discharge status: Home / Self Care | Payer: Self-pay | Source: Ambulatory Visit | Attending: Gastroenterology

## 2018-05-24 ENCOUNTER — Encounter: Payer: Self-pay | Admitting: *Deleted

## 2018-05-24 ENCOUNTER — Ambulatory Visit
Admission: RE | Admit: 2018-05-24 | Discharge: 2018-05-24 | Disposition: A | Discharge status: Home / Self Care | Payer: Medicare Other | Source: Ambulatory Visit | Attending: Gastroenterology | Admitting: Gastroenterology

## 2018-05-24 DIAGNOSIS — E119 Type 2 diabetes mellitus without complications: Secondary | ICD-10-CM | POA: Diagnosis not present

## 2018-05-24 DIAGNOSIS — Z79899 Other long term (current) drug therapy: Secondary | ICD-10-CM | POA: Insufficient documentation

## 2018-05-24 DIAGNOSIS — F329 Major depressive disorder, single episode, unspecified: Secondary | ICD-10-CM | POA: Insufficient documentation

## 2018-05-24 DIAGNOSIS — K219 Gastro-esophageal reflux disease without esophagitis: Secondary | ICD-10-CM | POA: Diagnosis not present

## 2018-05-24 DIAGNOSIS — Z794 Long term (current) use of insulin: Secondary | ICD-10-CM | POA: Insufficient documentation

## 2018-05-24 DIAGNOSIS — Z85828 Personal history of other malignant neoplasm of skin: Secondary | ICD-10-CM | POA: Diagnosis not present

## 2018-05-24 DIAGNOSIS — D123 Benign neoplasm of transverse colon: Secondary | ICD-10-CM | POA: Insufficient documentation

## 2018-05-24 DIAGNOSIS — Z791 Long term (current) use of non-steroidal anti-inflammatories (NSAID): Secondary | ICD-10-CM | POA: Diagnosis not present

## 2018-05-24 DIAGNOSIS — Z79890 Hormone replacement therapy (postmenopausal): Secondary | ICD-10-CM | POA: Insufficient documentation

## 2018-05-24 DIAGNOSIS — E785 Hyperlipidemia, unspecified: Secondary | ICD-10-CM | POA: Diagnosis not present

## 2018-05-24 DIAGNOSIS — E039 Hypothyroidism, unspecified: Secondary | ICD-10-CM | POA: Diagnosis not present

## 2018-05-24 DIAGNOSIS — F419 Anxiety disorder, unspecified: Secondary | ICD-10-CM | POA: Insufficient documentation

## 2018-05-24 DIAGNOSIS — Q439 Congenital malformation of intestine, unspecified: Secondary | ICD-10-CM | POA: Diagnosis not present

## 2018-05-24 DIAGNOSIS — Z7982 Long term (current) use of aspirin: Secondary | ICD-10-CM | POA: Diagnosis not present

## 2018-05-24 DIAGNOSIS — G4730 Sleep apnea, unspecified: Secondary | ICD-10-CM | POA: Insufficient documentation

## 2018-05-24 DIAGNOSIS — Z9989 Dependence on other enabling machines and devices: Secondary | ICD-10-CM | POA: Diagnosis not present

## 2018-05-24 DIAGNOSIS — Z1211 Encounter for screening for malignant neoplasm of colon: Secondary | ICD-10-CM | POA: Diagnosis present

## 2018-05-24 HISTORY — DX: Diverticulitis of intestine, part unspecified, without perforation or abscess without bleeding: K57.92

## 2018-05-24 HISTORY — DX: Nonscarring hair loss, unspecified: L65.9

## 2018-05-24 HISTORY — PX: COLONOSCOPY WITH PROPOFOL: SHX5780

## 2018-05-24 HISTORY — DX: Unspecified convulsions: R56.9

## 2018-05-24 SURGERY — COLONOSCOPY WITH PROPOFOL
Anesthesia: General

## 2018-05-24 MED ORDER — EPHEDRINE SULFATE 50 MG/ML IJ SOLN
INTRAMUSCULAR | Status: DC | PRN
  Administered 2018-05-24: 10 mg via INTRAVENOUS

## 2018-05-24 MED ORDER — PROPOFOL 500 MG/50ML IV EMUL
INTRAVENOUS | Status: DC | PRN
  Administered 2018-05-24: 130 ug/kg/min via INTRAVENOUS

## 2018-05-24 MED ORDER — SODIUM CHLORIDE 0.9 % IV SOLN
INTRAVENOUS | Status: DC
  Administered 2018-05-24 (×2): via INTRAVENOUS

## 2018-05-24 MED ORDER — LIDOCAINE HCL (PF) 2 % IJ SOLN
INTRAMUSCULAR | Status: AC
  Filled 2018-05-24: qty 10

## 2018-05-24 MED ORDER — MIDAZOLAM HCL 2 MG/2ML IJ SOLN
INTRAMUSCULAR | Status: DC | PRN
  Administered 2018-05-24 (×2): 1 mg via INTRAVENOUS

## 2018-05-24 MED ORDER — FENTANYL CITRATE (PF) 100 MCG/2ML IJ SOLN
INTRAMUSCULAR | Status: DC | PRN
  Administered 2018-05-24: 50 ug via INTRAVENOUS
  Administered 2018-05-24 (×2): 25 ug via INTRAVENOUS

## 2018-05-24 MED ORDER — MIDAZOLAM HCL 2 MG/2ML IJ SOLN
INTRAMUSCULAR | Status: AC
  Filled 2018-05-24: qty 2

## 2018-05-24 MED ORDER — PROPOFOL 10 MG/ML IV BOLUS
INTRAVENOUS | Status: DC | PRN
  Administered 2018-05-24: 20 mg via INTRAVENOUS
  Administered 2018-05-24: 30 mg via INTRAVENOUS
  Administered 2018-05-24: 50 mg via INTRAVENOUS

## 2018-05-24 MED ORDER — PROPOFOL 500 MG/50ML IV EMUL
INTRAVENOUS | Status: AC
  Filled 2018-05-24: qty 50

## 2018-05-24 MED ORDER — FENTANYL CITRATE (PF) 100 MCG/2ML IJ SOLN
INTRAMUSCULAR | Status: AC
  Filled 2018-05-24: qty 2

## 2018-05-24 NOTE — Anesthesia Preprocedure Evaluation (Signed)
Anesthesia Evaluation  Patient identified by MRN, date of birth, ID band Patient awake    Reviewed: Allergy & Precautions, H&P , NPO status , Patient's Chart, lab work & pertinent test results, reviewed documented beta blocker date and time   History of Anesthesia Complications Negative for: history of anesthetic complications  Airway Mallampati: III  TM Distance: >3 FB Neck ROM: full    Dental  (+) Missing, Dental Advidsory Given, Teeth Intact   Pulmonary neg shortness of breath, sleep apnea and Continuous Positive Airway Pressure Ventilation , neg COPD, neg recent URI,           Cardiovascular Exercise Tolerance: Good negative cardio ROS       Neuro/Psych Seizures -,  PSYCHIATRIC DISORDERS Anxiety Depression    GI/Hepatic Neg liver ROS, GERD  ,  Endo/Other  diabetesHypothyroidism   Renal/GU negative Renal ROS  negative genitourinary   Musculoskeletal   Abdominal   Peds  Hematology negative hematology ROS (+)   Anesthesia Other Findings Past Medical History: No date: Acid reflux 2012: Alopecia No date: Anxiety No date: Arthritis No date: Depression No date: Diabetes mellitus without complication (HCC) No date: Diverticulitis No date: Hyperlipidemia No date: Hypothyroidism 2009: Seizures (HCC) No date: Skin cancer No date: Sleep apnea No date: Thyroid disease   Reproductive/Obstetrics negative OB ROS                             Anesthesia Physical Anesthesia Plan  ASA: III  Anesthesia Plan: General   Post-op Pain Management:    Induction: Intravenous  PONV Risk Score and Plan: 3 and Propofol infusion and TIVA  Airway Management Planned: Nasal Cannula  Additional Equipment:   Intra-op Plan:   Post-operative Plan:   Informed Consent: I have reviewed the patients History and Physical, chart, labs and discussed the procedure including the risks, benefits and  alternatives for the proposed anesthesia with the patient or authorized representative who has indicated his/her understanding and acceptance.   Dental Advisory Given  Plan Discussed with: Anesthesiologist, CRNA and Surgeon  Anesthesia Plan Comments:         Anesthesia Quick Evaluation

## 2018-05-24 NOTE — Anesthesia Post-op Follow-up Note (Signed)
Anesthesia QCDR form completed.        

## 2018-05-24 NOTE — Transfer of Care (Signed)
Immediate Anesthesia Transfer of Care Note  Patient: Taylor Thompson  Procedure(s) Performed: COLONOSCOPY WITH PROPOFOL (N/A )  Patient Location: PACU  Anesthesia Type:General  Level of Consciousness: awake  Airway & Oxygen Therapy: Patient Spontanous Breathing and Patient connected to nasal cannula oxygen  Post-op Assessment: Report given to RN and Post -op Vital signs reviewed and stable  Post vital signs: Reviewed and stable  Last Vitals:  Vitals Value Taken Time  BP    Temp    Pulse 91 05/24/2018  3:31 PM  Resp 14 05/24/2018  3:31 PM  SpO2 98 % 05/24/2018  3:31 PM  Vitals shown include unvalidated device data.  Last Pain:  Vitals:   05/24/18 1343  TempSrc: Tympanic         Complications: No apparent anesthesia complications

## 2018-05-24 NOTE — H&P (Signed)
Outpatient short stay form Pre-procedure 05/24/2018 2:42 PM Lollie Sails MD  Primary Physician: Dr [name]  Reason for visit: Colonoscopy  History of present illness: Patient is a 62 year old female presenting today for a screening colonoscopy.  She denies any problems with abdominal pain rectal bleeding.  She occasionally has some her stools however they seem to be more so related to dietary issues.  Of note she also takes metformin.  She tolerated her prep well.  She takes no aspirin or blood thinning agent with the exception of 81mg  aspirin.    Current Facility-Administered Medications:  .  0.9 %  sodium chloride infusion, , Intravenous, Continuous, Lollie Sails, MD, Last Rate: 20 mL/hr at 05/24/18 1411  Medications Prior to Admission  Medication Sig Dispense Refill Last Dose  . aspirin EC 81 MG tablet Take 81 mg by mouth daily.   Past Month at Unknown time  . atorvastatin (LIPITOR) 80 MG tablet Take 80 mg by mouth daily. Pt taking differently. Takes 160mg  daily     . clonazePAM (KLONOPIN) 0.5 MG tablet    Past Month at Unknown time  . clotrimazole-betamethasone (LOTRISONE) cream Apply 1 application topically 2 (two) times daily.     . Dulaglutide 0.75 MG/0.5ML SOPN Inject 0.75 mg into the skin every 7 (seven) days.     . DULoxetine (CYMBALTA) 30 MG capsule 60 mg.    05/23/2018 at Unknown time  . escitalopram (LEXAPRO) 20 MG tablet Take 20 mg by mouth daily.     . famciclovir (FAMVIR) 500 MG tablet Take 500 mg by mouth 3 (three) times daily. As needed for outbreak for duration of 5 days     . glucagon 1 MG injection Inject 1 mg into the muscle once as needed (as needed for low blood sugar).     Marland Kitchen LANTUS SOLOSTAR 100 UNIT/ML Solostar Pen Inject 45 Units into the skin daily.    05/23/2018 at Unknown time  . levothyroxine (SYNTHROID, LEVOTHROID) 25 MCG tablet Take 25 mcg by mouth daily before breakfast.   05/23/2018 at Unknown time  . metFORMIN (GLUCOPHAGE) 1000 MG tablet  Take 2,000 mg by mouth daily.   05/23/2018 at Unknown time  . ranitidine (ZANTAC) 150 MG capsule Take 150 mg by mouth 2 (two) times daily.    05/23/2018 at Unknown time  . traZODone (DESYREL) 100 MG tablet Take 200 mg by mouth at bedtime.    05/23/2018 at Unknown time  . ARIPiprazole (ABILIFY) 5 MG tablet    Taking  . atorvastatin (LIPITOR) 40 MG tablet    Taking  . DULoxetine (CYMBALTA) 60 MG capsule Take 60 mg by mouth daily.  0   . empagliflozin (JARDIANCE) 10 MG TABS tablet Take 10 mg by mouth daily.   Taking  . empagliflozin (JARDIANCE) 25 MG TABS tablet Take 25 mg by mouth daily.   Taking  . escitalopram (LEXAPRO) 10 MG tablet    Taking  . estradiol (ESTRACE) 0.1 MG/GM vaginal cream Apply 0.5 mg with fingertip to just inside the vaginal introitus on Monday, Wednesday and Friday nights 30 g 12   . famciclovir (FAMVIR) 250 MG tablet Take 1 tablet (250 mg total) by mouth 2 (two) times daily. 15 tablet 0 Taking  . HUMALOG KWIKPEN 100 UNIT/ML KiwkPen    Taking  . hydrOXYzine (ATARAX/VISTARIL) 50 MG tablet    Taking  . ibuprofen (ADVIL,MOTRIN) 800 MG tablet    Taking  . insulin lispro protamine-lispro (HUMALOG 75/25 MIX) (75-25) 100 UNIT/ML SUSP  injection Inject into the skin.   Taking  . metformin (FORTAMET) 1000 MG (OSM) 24 hr tablet    Taking  . methocarbamol (ROBAXIN) 500 MG tablet    Not Taking at Unknown time  . sertraline (ZOLOFT) 100 MG tablet Take 100 mg by mouth daily.   Not Taking at Unknown time  . triamcinolone (KENALOG) 0.1 % paste Use as directed 1 application in the mouth or throat 2 (two) times daily. 5 g 12 Taking  . TRULICITY 2.54 YH/0.6CB SOPN    Taking     Allergies  Allergen Reactions  . Codeine   . Dilaudid [Hydromorphone Hcl] Itching  . Sulfa Antibiotics      Past Medical History:  Diagnosis Date  . Acid reflux   . Alopecia 2012  . Anxiety   . Arthritis   . Depression   . Diabetes mellitus without complication ([place])   . Diverticulitis   . Hyperlipidemia    . Hypothyroidism   . Seizures ([place]) 2009  . Skin cancer   . Sleep apnea   . Thyroid disease     Review of systems:      Physical Exam    Heart and lungs: Regular rate and rhythm without rub or gallop, lungs are bilaterally clear.    HEENT: Normocephalic atraumatic eyes are anicteric    Other:    Pertinant exam for procedure: Soft nontender nondistended bowel sounds positive normoactive   Planned proceedures: Colonoscopy and indicated procedures. I have discussed the risks benefits and complications of procedures to include not limited to bleeding, infection, perforation and the risk of sedation and the patient wishes to proceed.    Lollie Sails, MD Gastroenterology 05/24/2018  2:42 PM

## 2018-05-24 NOTE — Op Note (Signed)
[facility] Patient Name: Taylor Thompson Procedure Date: 05/24/2018 2:42 PM MRN: [id number] Account #: [id number] Date of Birth: [birth year] Admit Type: Outpatient Age: 62 Room: [facility] ENDO ROOM 3 Gender: Female Note Status: Finalized Procedure:            Colonoscopy Indications:          Screening for colorectal malignant neoplasm Providers:            Lollie Sails, MD Referring MD:         No Local Md, MD (Referring MD) Medicines:            Monitored Anesthesia Care Complications:        No immediate complications. Procedure:            Pre-Anesthesia Assessment:                       - ASA Grade Assessment: III - A patient with severe                        systemic disease.                       After obtaining informed consent, the colonoscope was                        passed under direct vision. Throughout the procedure,                        the patient's blood pressure, pulse, and oxygen                        saturations were monitored continuously. The                        Colonoscope was introduced through the anus and                        advanced to the the cecum, identified by appendiceal                        orifice and ileocecal valve. The colonoscopy was                        performed with moderate difficulty due to a tortuous                        colon. Successful completion of the procedure was aided                        by changing the patient to a prone position and using                        manual pressure. The patient tolerated the procedure                        well. The quality of the bowel preparation was good. Findings:      A 5 mm polyp was found in the proximal transverse colon. The polyp was       sessile. The polyp was removed with a cold snare. Resection and  retrieval were complete.      The retroflexed view of the distal rectum and anal verge was normal and       showed no anal or  rectal abnormalities.      The digital rectal exam was normal. Impression:           - One 5 mm polyp in the proximal transverse colon,                        removed with a cold snare. Resected and retrieved.                       - The distal rectum and anal verge are normal on                        retroflexion view. Recommendation:       - Advance diet as tolerated.                       - Await pathology results. Procedure Code(s):    --- Professional ---                       [phone], Colonoscopy, flexible; with removal of tumor(s),                        polyp(s), or other lesion(s) by snare technique Diagnosis Code(s):    --- Professional ---                       Z12.11, Encounter for screening for malignant neoplasm                        of colon                       D12.3, Benign neoplasm of transverse colon (hepatic                        flexure or splenic flexure) CPT copyright 2017 American Medical Association. All rights reserved. The codes documented in this report are preliminary and upon coder review may  be revised to meet current compliance requirements. Lollie Sails, MD 05/24/2018 3:24:10 PM This report has been signed electronically. Number of Addenda: 0 Note Initiated On: 05/24/2018 2:42 PM Scope Withdrawal Time: 0 hours 8 minutes 37 seconds  Total Procedure Duration: 0 hours 26 minutes 11 seconds       [facility]

## 2018-05-25 NOTE — Anesthesia Postprocedure Evaluation (Signed)
Anesthesia Post Note  Patient: Xitlalli Newhard Dexheimer  Procedure(s) Performed: COLONOSCOPY WITH PROPOFOL (N/A )  Patient location during evaluation: Endoscopy Anesthesia Type: General Level of consciousness: awake and alert Pain management: pain level controlled Vital Signs Assessment: post-procedure vital signs reviewed and stable Respiratory status: spontaneous breathing, nonlabored ventilation, respiratory function stable and patient connected to nasal cannula oxygen Cardiovascular status: blood pressure returned to baseline and stable Postop Assessment: no apparent nausea or vomiting Anesthetic complications: no     Last Vitals:  Vitals:   05/24/18 1343 05/24/18 1532  BP: 134/79 103/68  Pulse: 80   Resp: 16   Temp: 36.6 C (!) 36.4 C  SpO2: 96%     Last Pain:  Vitals:   05/24/18 1552  TempSrc:   PainSc: 0-No pain                 Martha Clan

## 2018-05-26 ENCOUNTER — Encounter: Payer: Self-pay | Admitting: Gastroenterology

## 2018-05-28 LAB — SURGICAL PATHOLOGY

## 2018-07-19 ENCOUNTER — Ambulatory Visit (INDEPENDENT_AMBULATORY_CARE_PROVIDER_SITE_OTHER): Payer: Medicare Other | Admitting: Urology

## 2018-07-19 ENCOUNTER — Encounter: Payer: Self-pay | Admitting: Urology

## 2018-07-19 ENCOUNTER — Telehealth: Payer: Self-pay | Admitting: Urology

## 2018-07-19 VITALS — BP 146/74 | HR 76 | Ht 60.0 in | Wt 196.0 lb

## 2018-07-19 DIAGNOSIS — N952 Postmenopausal atrophic vaginitis: Secondary | ICD-10-CM | POA: Diagnosis not present

## 2018-07-19 DIAGNOSIS — N3941 Urge incontinence: Secondary | ICD-10-CM

## 2018-07-19 LAB — BLADDER SCAN AMB NON-IMAGING: Scan Result: 0

## 2018-07-19 NOTE — Progress Notes (Signed)
07/19/2018 10:50 AM   Taylor Thompson Jun 29, 1956 [id number]  Referring provider: Zeb Comfort, MD [place], [place] [address]  Chief Complaint  Patient presents with  . Urinary Incontinence    HPI: 62 yo WF with urge incontinence and vaginal atrophy who presents today for follow up.  Background history Patient is a 11 -year-old Caucasian female who is referred to [place] by [name], MD for urinary incontinence.  Patient states that she has had urinary incontinence since January 15th, 2019 after suffering a fall going down the stairs.   She states she is experiencing urge incontinence several times during the day.   A MRI performed on 12/17/2017 noted no acute or post traumatic finding. No specific cause of the presenting symptoms is identified. Mild non-compressive stenosis at L3-4 and L4-5 as outlined above. The facet arthropathy could be associated with back pain.  She is having associated urinary urgency and nocturia x 1-2.  Patient denies any gross hematuria, dysuria or suprapubic/flank pain.  Patient denies any fevers, chills, nausea or vomiting.   Her PVR is 20 mL.  She does not have incontinence of stool or numbness in her saddle area.   She does not have a history of urinary tract infections, STI's or injury to the bladder.   She does not have a history of nephrolithiasis, GU surgery or GU trauma.   She is not sexually active.   She is post menopausal.   She admits to diarrhea.   She is not having pain with bladder filling.  She is drinking 3 to 4 bottles of water daily.   She is drinking one caffeinated beverages daily.  She seldom drinks soda, but it is usually diet Gingerale.  No juices.  Not drinking alcohol.     She could not tolerate Myrbetriq or Toviaz due to constipation.     Today, the patient has been experiencing urgency x 4-7 (worse), frequency x 4-7 (stable), not restricting fluids to avoid visits to the restroom, is engaging in toilet  mapping, incontinence x 4-7 (worse) and nocturia x 0-3 (stable). Her PVR is 0 mL.   Patient denies any gross hematuria, dysuria or suprapubic/flank pain.  Patient denies any fevers, chills, nausea or vomiting.   She is finding the Toviaz 8 mg daily intolerable as well.    She completed PT.    She is not applying the vaginal estrogen cream three times weekly.   She is sleeping with BiPAP machine.    Her major complaints at this visit continue to be frequency, urgency, nocturia and incontinence.      PMH: Past Medical History:  Diagnosis Date  . Acid reflux   . Alopecia 2012  . Anxiety   . Arthritis   . Depression   . Diabetes mellitus without complication ([place])   . Diverticulitis   . Hyperlipidemia   . Hypothyroidism   . Seizures ([place]) 2009  . Skin cancer   . Sleep apnea   . Thyroid disease     Surgical History: Past Surgical History:  Procedure Laterality Date  . ABDOMINAL HYSTERECTOMY  1986   Vaginal  . ABDOMINAL HYSTERECTOMY    . CHOLECYSTECTOMY  2009  . COLONOSCOPY WITH PROPOFOL N/A 05/24/2018   Procedure: COLONOSCOPY WITH PROPOFOL;  Surgeon: Lollie Sails, MD;  Location: [facility] ENDOSCOPY;  Service: Endoscopy;  Laterality: N/A;  . SQUAMOUS CELL CARCINOMA EXCISION  2014    Home Medications:  Allergies as of 07/19/2018  Reactions   Codeine    Dilaudid [hydromorphone Hcl] Itching   Sulfa Antibiotics       Medication List        Accurate as of 07/19/18 10:50 AM. Always use your most recent med list.          aspirin EC 81 MG tablet Take 81 mg by mouth daily.   atorvastatin 80 MG tablet Commonly known as:  LIPITOR Take 80 mg by mouth daily. Pt taking differently. Takes 160mg  daily   clonazePAM 0.5 MG tablet Commonly known as:  KLONOPIN   clotrimazole-betamethasone cream Commonly known as:  LOTRISONE Apply 1 application topically 2 (two) times daily.   DULoxetine 60 MG capsule Commonly known as:  CYMBALTA Take 60 mg by mouth daily.   escitalopram  20 MG tablet Commonly known as:  LEXAPRO Take 20 mg by mouth daily.   estradiol 0.1 MG/GM vaginal cream Commonly known as:  ESTRACE Apply 0.5 mg with fingertip to just inside the vaginal introitus on Monday, Wednesday and Friday nights   famciclovir 500 MG tablet Commonly known as:  FAMVIR Take 500 mg by mouth 3 (three) times daily. As needed for outbreak for duration of 5 days   glucagon 1 MG injection Inject 1 mg into the muscle once as needed (as needed for low blood sugar).   HUMALOG KWIKPEN 100 UNIT/ML KiwkPen Generic drug:  insulin lispro   hydrOXYzine 50 MG tablet Commonly known as:  ATARAX/VISTARIL   ibuprofen 800 MG tablet Commonly known as:  ADVIL,MOTRIN   LANTUS SOLOSTAR 100 UNIT/ML Solostar Pen Generic drug:  Insulin Glargine Inject 45 Units into the skin daily.   levothyroxine 25 MCG tablet Commonly known as:  SYNTHROID, LEVOTHROID Take 25 mcg by mouth daily before breakfast.   metFORMIN 1000 MG tablet Commonly known as:  GLUCOPHAGE Take 2,000 mg by mouth daily.   methocarbamol 500 MG tablet Commonly known as:  ROBAXIN   ranitidine 150 MG capsule Commonly known as:  ZANTAC Take 150 mg by mouth 2 (two) times daily.   traZODone 100 MG tablet Commonly known as:  DESYREL Take 200 mg by mouth at bedtime.   TRULICITY 3.29 JJ/8.8CZ Sopn Generic drug:  Dulaglutide       Allergies:  Allergies  Allergen Reactions  . Codeine   . Dilaudid [Hydromorphone Hcl] Itching  . Sulfa Antibiotics     Family History: Family History  Problem Relation Age of Onset  . Dementia Mother   . CAD Mother   . Diabetes Mother   . Hypertension Mother   . Osteoarthritis Mother   . Cancer Father   . Cirrhosis Father   . Kidney cancer Neg Hx   . Kidney disease Neg Hx   . Prostate cancer Neg Hx     Social History:  reports that she has never smoked. She has never used smokeless tobacco. She reports that she drinks alcohol. She reports that she does not use  drugs.  ROS: UROLOGY Frequent Urination?: Yes Hard to postpone urination?: Yes Burning/pain with urination?: No Get up at night to urinate?: Yes Leakage of urine?: Yes Urine stream starts and stops?: No Trouble starting stream?: No Do you have to strain to urinate?: No Blood in urine?: No Urinary tract infection?: No Sexually transmitted disease?: No Injury to kidneys or bladder?: No Painful intercourse?: No Weak stream?: No Currently pregnant?: No Vaginal bleeding?: No Last menstrual period?: n  Gastrointestinal Nausea?: No Vomiting?: No Indigestion/heartburn?: No Diarrhea?: No Constipation?: No  Constitutional Fever:  No Night sweats?: No Weight loss?: No Fatigue?: No  Skin Skin rash/lesions?: No Itching?: No  Eyes Blurred vision?: No Double vision?: No  Ears/Nose/Throat Sore throat?: No Sinus problems?: Yes  Hematologic/Lymphatic Swollen glands?: No Easy bruising?: No  Cardiovascular Leg swelling?: No Chest pain?: No  Respiratory Cough?: No Shortness of breath?: No  Endocrine Excessive thirst?: No  Musculoskeletal Back pain?: Yes Joint pain?: No  Neurological Headaches?: No Dizziness?: No  Psychologic Depression?: No Anxiety?: Yes  Physical Exam: BP (!) 146/74 (BP Location: Left Arm, Patient Position: Sitting, Cuff Size: Normal)   Pulse 76   Ht 5' (1.524 m)   Wt 196 lb (88.9 kg)   BMI 38.28 kg/m   Constitutional: Well nourished. Alert and oriented, No acute distress. HEENT:  AT, moist mucus membranes. Trachea midline, no masses. Cardiovascular: No clubbing, cyanosis, or edema. Respiratory: Normal respiratory effort, no increased work of breathing. Skin: No rashes, bruises or suspicious lesions. Lymph: No cervical or inguinal adenopathy. Neurologic: Grossly intact, no focal deficits, moving all 4 extremities. Psychiatric: Normal mood and affect.   Laboratory Data: Lab Results  Component Value Date   WBC 7.0 04/13/2018    HGB 13.5 04/13/2018   HCT 39.3 04/13/2018   MCV 88.5 04/13/2018   PLT 184 04/13/2018    Lab Results  Component Value Date   CREATININE 0.67 04/13/2018    No results found for: PSA  No results found for: TESTOSTERONE  No results found for: HGBA1C  No results found for: TSH  No results found for: CHOL, HDL, CHOLHDL, VLDL, LDLCALC  No results found for: AST No results found for: ALT No components found for: ALKALINEPHOPHATASE No components found for: BILIRUBINTOTAL  No results found for: ESTRADIOL  Urinalysis    Component Value Date/Time   COLORURINE YELLOW (A) 04/13/2018 1148   APPEARANCEUR CLOUDY (A) 04/13/2018 1148   LABSPEC 1.020 04/13/2018 1148   PHURINE 5.0 04/13/2018 1148   GLUCOSEU NEGATIVE 04/13/2018 1148   HGBUR NEGATIVE 04/13/2018 1148   BILIRUBINUR NEGATIVE 04/13/2018 1148   KETONESUR 5 (A) 04/13/2018 1148   PROTEINUR NEGATIVE 04/13/2018 1148   NITRITE NEGATIVE 04/13/2018 1148   LEUKOCYTESUR SMALL (A) 04/13/2018 1148    I have reviewed the labs.   Pertinent Imaging: Results for KYRSTEN, DELEEUW (MRN [id number]) as of 07/19/2018 10:54  Ref. Range 07/19/2018 10:15  Scan Result Unknown 0     I have independently reviewed the films.    Assessment & Plan:    1. Urge Incontinence Could not tolerated the OAB medications Completed physical therapy explained the PTNS provides treatment by indirectly providing electrical stimulation to the nerves responsible for bladder and pelvic floor function - a needle electrode generates an adjustable electrical pulse that travels to the sacral plexus via the tibial nerve which is located in the ankle, among other functions, the sacral nerve plexus regulates bladder and pelvic floor function - treatment protocol requires once-a-week treatments for 12 weeks, 30 minutes per session and many patients begin to see improvements by the 6th treatment. Patients who respond to treatment may require occasional treatments (~  once every 3 weeks) to sustain improvements. PTNS is a low-risk procedure. The most common side-effects with PTNS treatment are temporary and minor, resulting from the placement of the needle electrode. They include minor bleeding, mild pain and skin inflammation and patients have seen up to an 80% success rate with this form of treatment  - RTC for PTNS   2. Vaginal atrophy Continue the vaginal estrogen  cream  Return for schedule PTNS in [place] .  These notes generated with voice recognition software. I apologize for typographical errors.  Taylor Council, PA-C  [facility] [address] [place], [place] [address] [phone]

## 2018-07-19 NOTE — Telephone Encounter (Signed)
Would you check Taylor Thompson's insurance to see if she can have PTNS?

## 2018-07-23 NOTE — Telephone Encounter (Signed)
PTNS CPT [phone] 12 visits Patient has Medicare & BCBS - prior authorization is not required.  PTNS therapy scheduled.

## 2018-07-31 ENCOUNTER — Ambulatory Visit (INDEPENDENT_AMBULATORY_CARE_PROVIDER_SITE_OTHER): Payer: Medicare Other

## 2018-07-31 DIAGNOSIS — N3941 Urge incontinence: Secondary | ICD-10-CM | POA: Diagnosis not present

## 2018-07-31 NOTE — Progress Notes (Signed)
PTNS  Session # 1  Health & Social Factors: no change Caffeine: 1 Alcohol: 0 Daytime voids #per day: 8 Night-time voids #per night: 3 Urgency: strong Incontinence Episodes #per day: 2 Ankle used: left Treatment Setting: 16 Feeling/ Response: sensory Comments: none  Preformed By: Fonnie Jarvis, CMA  Follow Up: 1 week

## 2018-08-07 ENCOUNTER — Ambulatory Visit (INDEPENDENT_AMBULATORY_CARE_PROVIDER_SITE_OTHER): Payer: Medicare Other

## 2018-08-07 DIAGNOSIS — N3941 Urge incontinence: Secondary | ICD-10-CM

## 2018-08-07 NOTE — Progress Notes (Signed)
PTNS  Session # 2  Health & Social Factors: No Change Caffeine: 1 Alcohol: 1-2 per week Daytime voids #per day: 5-6 Night-time voids #per night: 1-2 Urgency: Mild Incontinence Episodes #per day: 1 Ankle used: Right Treatment Setting: 4 Feeling/ Response: Sensory  Preformed By: Cristie Hem, CMA  Follow Up: As Scheduled in 1 week

## 2018-08-14 ENCOUNTER — Ambulatory Visit (INDEPENDENT_AMBULATORY_CARE_PROVIDER_SITE_OTHER): Payer: Medicare Other | Admitting: Family Medicine

## 2018-08-14 DIAGNOSIS — N3941 Urge incontinence: Secondary | ICD-10-CM

## 2018-08-14 NOTE — Progress Notes (Signed)
PTNS  Session # 3  Health & Social Factors: no change Caffeine: 1-2 Alcohol: 0 Daytime voids #per day: 6-7 Night-time voids #per night: 1 Urgency: mild Incontinence Episodes #per day: less than 1 Ankle used: left Treatment Setting: 9 Feeling/ Response: both Comments: Patient tolerated well.  Preformed By: Elberta Leatherwood, CMA  Follow Up: 1 week #4

## 2018-08-21 ENCOUNTER — Ambulatory Visit (INDEPENDENT_AMBULATORY_CARE_PROVIDER_SITE_OTHER): Payer: Medicare Other | Admitting: Family Medicine

## 2018-08-21 DIAGNOSIS — N3941 Urge incontinence: Secondary | ICD-10-CM

## 2018-08-21 NOTE — Progress Notes (Signed)
PTNS  Session # 4  Health & Social Factors: no change Caffeine: 1-2 Alcohol: 0 Daytime voids #per day: 7 Night-time voids #per night: 1 Urgency: strong Incontinence Episodes #per day: 1 Ankle used: right Treatment Setting: 10 Feeling/ Response: sensory Comments: patient tolerated well.  Preformed By: Elberta Leatherwood, CMA  Follow Up: 1 week #5

## 2018-08-28 ENCOUNTER — Ambulatory Visit (INDEPENDENT_AMBULATORY_CARE_PROVIDER_SITE_OTHER): Payer: Medicare Other | Admitting: Family Medicine

## 2018-08-28 DIAGNOSIS — N3941 Urge incontinence: Secondary | ICD-10-CM

## 2018-08-28 NOTE — Progress Notes (Signed)
PTNS  Session # 5  Health & Social Factors: no change Caffeine: 2 Alcohol: 0 Daytime voids #per day: 6 Night-time voids #per night: 1 Urgency: strong Incontinence Episodes #per day: 1 Ankle used: left Treatment Setting: 7 Feeling/ Response: both Comments: Patient tolerated well.  Preformed By: Elberta Leatherwood, CMA  Follow Up: 1 week

## 2018-09-04 ENCOUNTER — Ambulatory Visit (INDEPENDENT_AMBULATORY_CARE_PROVIDER_SITE_OTHER): Payer: Medicare Other | Admitting: Family Medicine

## 2018-09-04 DIAGNOSIS — N3941 Urge incontinence: Secondary | ICD-10-CM

## 2018-09-04 NOTE — Progress Notes (Signed)
PTNS  Session # 6  Health & Social Factors: no change Caffeine: 2 Alcohol: 0 Daytime voids #per day: 6 Night-time voids #per night: 2 Urgency: strong Incontinence Episodes #per day: 0 Ankle used: right Treatment Setting: 2 Feeling/ Response:Both Comments: Patient tolerated well  Preformed By: Elberta Leatherwood, CMA  Follow Up: 1 week

## 2018-09-11 ENCOUNTER — Ambulatory Visit (INDEPENDENT_AMBULATORY_CARE_PROVIDER_SITE_OTHER): Payer: Medicare Other

## 2018-09-11 DIAGNOSIS — N3941 Urge incontinence: Secondary | ICD-10-CM | POA: Diagnosis not present

## 2018-09-11 NOTE — Progress Notes (Signed)
PTNS  Session # 7  Health & Social Factors: same Caffeine: 2 Alcohol: 0 Daytime voids #per day: 8 Night-time voids #per night: 2 Urgency: mild Incontinence Episodes #per day: 0 Ankle used: left Treatment Setting: 8 Feeling/ Response: both Comments: none  Preformed By: Fonnie Jarvis, CMA    Follow Up: 1 week

## 2018-09-18 ENCOUNTER — Ambulatory Visit (INDEPENDENT_AMBULATORY_CARE_PROVIDER_SITE_OTHER): Payer: Medicare Other | Admitting: Family Medicine

## 2018-09-18 DIAGNOSIS — N3941 Urge incontinence: Secondary | ICD-10-CM | POA: Diagnosis not present

## 2018-09-18 NOTE — Progress Notes (Signed)
PTNS  Session # 8  Health & Social Factors: no change Caffeine: 2 Alcohol: 0 Daytime voids #per day: 10 Night-time voids #per night: 1 Urgency: mild Incontinence Episodes #per day: 1 Ankle used: right Treatment Setting: 1 Feeling/ Response: Both Comments: Patient tolerated well  Preformed By: Elberta Leatherwood, CMA  Follow Up: 1 week

## 2018-09-25 ENCOUNTER — Ambulatory Visit (INDEPENDENT_AMBULATORY_CARE_PROVIDER_SITE_OTHER): Payer: Medicare Other | Admitting: Family Medicine

## 2018-09-25 DIAGNOSIS — N3941 Urge incontinence: Secondary | ICD-10-CM

## 2018-09-25 NOTE — Progress Notes (Signed)
PTNS  Session # 9  Health & Social Factors: no change Caffeine: 2 Alcohol: 0 Daytime voids #per day: 8 Night-time voids #per night: 0 Urgency: mild Incontinence Episodes #per day: 0 Ankle used: right Treatment Setting: 7 Feeling/ Response: Both Comments: Patient tolerated well  Preformed By: Elberta Leatherwood, CMA  Follow Up: 1 week

## 2018-10-02 ENCOUNTER — Ambulatory Visit (INDEPENDENT_AMBULATORY_CARE_PROVIDER_SITE_OTHER): Payer: Medicare Other

## 2018-10-02 DIAGNOSIS — N3941 Urge incontinence: Secondary | ICD-10-CM

## 2018-10-02 NOTE — Progress Notes (Signed)
PTNS  Session # 10  Health & Social Factors: Changes Caffeine: 2 Alcohol: 0 Daytime voids #per day: 10-12 Night-time voids #per night: 2 Urgency: Mild Incontinence Episodes #per day: 2 Ankle used: Right Treatment Setting: 3 Feeling/ Response: Sensory Comments: Pt requested right ankle.  Preformed By: Cristie Hem, CMA   Follow Up: 1 week

## 2018-10-08 ENCOUNTER — Ambulatory Visit (INDEPENDENT_AMBULATORY_CARE_PROVIDER_SITE_OTHER): Payer: Medicare Other | Admitting: Urology

## 2018-10-08 DIAGNOSIS — N3941 Urge incontinence: Secondary | ICD-10-CM | POA: Diagnosis not present

## 2018-10-08 NOTE — Progress Notes (Signed)
Error

## 2018-10-08 NOTE — Progress Notes (Signed)
PTNS  Session # 11  Health & Social Factors: no change Caffeine: 1 cup of coffee Alcohol: none Daytime voids #per day: 6 Night-time voids #per night: <1 Urgency: moderate Incontinence Episodes #per day: 1 Ankle used: right Treatment Setting: 1 Feeling/ Response: patient felt Comments: none  Preformed By: Libby Maw)  Assistant: Alberteen Sam  Follow Up: as directed

## 2018-10-09 ENCOUNTER — Ambulatory Visit: Payer: Self-pay

## 2018-10-16 ENCOUNTER — Ambulatory Visit (INDEPENDENT_AMBULATORY_CARE_PROVIDER_SITE_OTHER): Payer: Medicare Other | Admitting: Urology

## 2018-10-16 DIAGNOSIS — N3941 Urge incontinence: Secondary | ICD-10-CM

## 2018-10-16 NOTE — Progress Notes (Signed)
PTNS  Session # 12  Health & Social Factors: no change Caffeine: 1 cup of coffee Alcohol: none Daytime voids #per day: 5 Night-time voids #per night: 0 Urgency: mild Incontinence Episodes #per day: 2 Ankle used: right Treatment Setting: 2 Feeling/ Response: reflex and patient response   Preformed By: Elizabeth Palau, [place])  Assistant: Fonnie Jarvis

## 2018-11-01 ENCOUNTER — Encounter: Payer: Self-pay | Admitting: Urology

## 2018-11-01 ENCOUNTER — Ambulatory Visit: Payer: Self-pay | Admitting: Urology

## 2018-11-01 NOTE — Progress Notes (Incomplete)
@ENCDATE @ 7:23 AM   Thereasa Parkin Thompson 1955-12-28 [id number]  Referring provider: Zeb Comfort, [place], [place] [address]  No chief complaint on file.   HPI: Taylor Thompson is a 62 yo M with a history of urge incontinence returns today for post-PTNS evaluation and management.    PMH: Past Medical History:  Diagnosis Date   Acid reflux    Alopecia 2012   Anxiety    Arthritis    Depression    Diabetes mellitus without complication ([place])    Diverticulitis    Hyperlipidemia    Hypothyroidism    Seizures ([place]) 2009   Skin cancer    Sleep apnea    Thyroid disease     Surgical History: Past Surgical History:  Procedure Laterality Date   ABDOMINAL HYSTERECTOMY  1986   Vaginal   ABDOMINAL HYSTERECTOMY     CHOLECYSTECTOMY  2009   COLONOSCOPY WITH PROPOFOL N/A 05/24/2018   Procedure: COLONOSCOPY WITH PROPOFOL;  Surgeon: Lollie Sails, MD;  Location: [facility] ENDOSCOPY;  Service: Endoscopy;  Laterality: N/A;   SQUAMOUS CELL CARCINOMA EXCISION  2014    Home Medications:  Allergies as of 11/01/2018      Reactions   Codeine    Dilaudid [hydromorphone Hcl] Itching   Sulfa Antibiotics       Medication List       Accurate as of November 01, 2018  7:23 AM. Always use your most recent med list.        aspirin EC 81 MG tablet Take 81 mg by mouth daily.   atorvastatin 80 MG tablet Commonly known as:  LIPITOR Take 80 mg by mouth daily. Pt taking differently. Takes 160mg  daily   clonazePAM 0.5 MG tablet Commonly known as:  KLONOPIN   clotrimazole-betamethasone cream Commonly known as:  LOTRISONE Apply 1 application topically 2 (two) times daily.   DULoxetine 60 MG capsule Commonly known as:  CYMBALTA Take 60 mg by mouth daily.   escitalopram 20 MG tablet Commonly known as:  LEXAPRO Take 20 mg by mouth daily.   estradiol 0.1 MG/GM vaginal cream Commonly known as:  ESTRACE Apply 0.5 mg with fingertip to  just inside the vaginal introitus on Monday, Wednesday and Friday nights   famciclovir 500 MG tablet Commonly known as:  FAMVIR Take 500 mg by mouth 3 (three) times daily. As needed for outbreak for duration of 5 days   glucagon 1 MG injection Inject 1 mg into the muscle once as needed (as needed for low blood sugar).   HUMALOG KWIKPEN 100 UNIT/ML KwikPen Generic drug:  insulin lispro   hydrOXYzine 50 MG tablet Commonly known as:  ATARAX/VISTARIL   ibuprofen 800 MG tablet Commonly known as:  ADVIL,MOTRIN   LANTUS SOLOSTAR 100 UNIT/ML Solostar Pen Generic drug:  Insulin Glargine Inject 45 Units into the skin daily.   levothyroxine 25 MCG tablet Commonly known as:  SYNTHROID, LEVOTHROID Take 25 mcg by mouth daily before breakfast.   metFORMIN 1000 MG tablet Commonly known as:  GLUCOPHAGE Take 2,000 mg by mouth daily.   methocarbamol 500 MG tablet Commonly known as:  ROBAXIN   ranitidine 150 MG capsule Commonly known as:  ZANTAC Take 150 mg by mouth 2 (two) times daily.   traZODone 100 MG tablet Commonly known as:  DESYREL Take 200 mg by mouth at bedtime.   TRULICITY 6.54 YT/0.3TW Sopn Generic drug:  Dulaglutide       Allergies:  Allergies  Allergen Reactions  Codeine    Dilaudid [Hydromorphone Hcl] Itching   Sulfa Antibiotics     Family History: Family History  Problem Relation Age of Onset   Dementia Mother    CAD Mother    Diabetes Mother    Hypertension Mother    Osteoarthritis Mother    Cancer Father    Cirrhosis Father    Kidney cancer Neg Hx    Kidney disease Neg Hx    Prostate cancer Neg Hx     Social History:  reports that she has never smoked. She has never used smokeless tobacco. She reports current alcohol use. She reports that she does not use drugs.  ROS:                                        Physical Exam: There were no vitals taken for this visit.  Constitutional:  Alert and oriented, No  acute distress. HEENT: [place] AT, moist mucus membranes.  Trachea midline, no masses. Cardiovascular: No clubbing, cyanosis, or edema. Respiratory: Normal respiratory effort, no increased work of breathing. GI: Abdomen is soft, nontender, nondistended, no abdominal masses GU: No CVA tenderness Lymph: No cervical or inguinal lymphadenopathy. Skin: No rashes, bruises or suspicious lesions. Neurologic: Grossly intact, no focal deficits, moving all 4 extremities. Psychiatric: Normal mood and affect.  Laboratory Data:   Pertinent Imaging: ***  Assessment & Plan:   1. Urge incontinence   @DIAGMED @  No follow-ups on file.  [facility] [address] [place], [place] [address] [phone]

## 2018-12-10 ENCOUNTER — Ambulatory Visit: Payer: Self-pay | Admitting: Urology

## 2018-12-24 ENCOUNTER — Ambulatory Visit: Payer: Self-pay | Admitting: Urology

## 2019-01-14 NOTE — Progress Notes (Signed)
01/15/2019 4:21 PM   Taylor Thompson 16-Jul-1956 [id number]  Referring provider: Zeb Comfort, MD [place], [place] [address]  Chief Complaint  Patient presents with  . Urge incontinence    HPI: Taylor Thompson is a 63 y.o. female Caucasian with urge incontinence and vaginal atrophy who presents today for follow up s/p PTNS.  Background history Patient is a 63 -year-old Caucasian female who is referred to [place] by [name], MD for urinary incontinence.  Patient states that she has had urinary incontinence since January 15th, 2019 after suffering a fall going down the stairs.   She states she is experiencing urge incontinence several times during the day.   A MRI performed on 12/17/2017 noted no acute or post traumatic finding. No specific cause of the presenting symptoms is identified. Mild non-compressive stenosis at L3-4 and L4-5 as outlined above. The facet arthropathy could be associated with back pain.  She is having associated urinary urgency and nocturia x 1-2.  Patient denies any gross hematuria, dysuria or suprapubic/flank pain.  Patient denies any fevers, chills, nausea or vomiting.   Her PVR is 20 mL.  She does not have incontinence of stool or numbness in her saddle area.   She does not have a history of urinary tract infections, STI's or injury to the bladder.   She does not have a history of nephrolithiasis, GU surgery or GU trauma.   She is not sexually active.   She is post menopausal.   She admits to diarrhea.   She is not having pain with bladder filling.  She is drinking 3 to 4 bottles of water daily.   She is drinking one caffeinated beverages daily.  She seldom drinks soda, but it is usually diet Gingerale.  No juices.  Not drinking alcohol.     She could not tolerate Myrbetriq or Toviaz due to constipation.     On 07/19/2018, the patient had been experiencing urgency x 4-7 (worse), frequency x 4-7 (stable), not restricting fluids to  avoid visits to the restroom, was engaging in toilet mapping, incontinence x 4-7 (worse) and nocturia x 0-3 (stable).  Her PVR was 0 mL.   Patient denied any gross hematuria, dysuria or suprapubic/flank pain.  Patient denied any fevers, chills, nausea or vomiting.   She was finding the Toviaz 8 mg daily intolerable as well.  She completed PT.  She was not applying the vaginal estrogen cream three times weekly.  She was sleeping with BiPAP machine.  Her major complaints on that visit continued to be frequency, urgency, nocturia and incontinence.  On today (01/15/2019), the patient is experiencing urgency x 4-7 (stable), frequency x >8 (worse), not restricting fluids to avoid visits to the restroom 0-3, is engaging in toilet mapping, incontinence x 0-3 (improved) and nocturia x 0-3 (stable).   Her BP is 124/82.   Her PVR is 61 mL.  She had a TIA on 01/01/2019 and is now working to get her health issues under control; she has been put on a blood pressure medicine (5 mg, lisinopril).  TIA happened immediately after checking blood sugar and took endocrine medicine, and once she was able to stop coughing and voiding she realized she had lost feeling on the left side of her face but did check and reports no drooping.  Patient reports cold night sweats and says the time she checked her blood sugar when it happened she found it was 400+ which was high for  her.  Her endocrinologist thinks it is most likely due to her thyroid issues.  Patient says that her urinary symptoms are 'okay,' except for her incontinence (day and night) has continued.  She reports no change in that on PTNS.  Patient denies stress incontinence; she says it is only urge incontinence and she cannot stop the stream once it has started.  It has no relationship between the frequency with which she has been voiding.  PMH: Past Medical History:  Diagnosis Date  . Acid reflux   . Alopecia 2012  . Anxiety   . Arthritis   . Depression   . Diabetes  mellitus without complication ([place])   . Diverticulitis   . Hyperlipidemia   . Hypothyroidism   . Seizures ([place]) 2009  . Skin cancer   . Sleep apnea   . Thyroid disease     Surgical History: Past Surgical History:  Procedure Laterality Date  . ABDOMINAL HYSTERECTOMY  1986   Vaginal  . ABDOMINAL HYSTERECTOMY    . CHOLECYSTECTOMY  2009  . COLONOSCOPY WITH PROPOFOL N/A 05/24/2018   Procedure: COLONOSCOPY WITH PROPOFOL;  Surgeon: Lollie Sails, MD;  Location: [facility] ENDOSCOPY;  Service: Endoscopy;  Laterality: N/A;  . SQUAMOUS CELL CARCINOMA EXCISION  2014    Home Medications:  Allergies as of 01/15/2019      Reactions   Codeine    Dilaudid [hydromorphone Hcl] Itching   Sulfa Antibiotics       Medication List       Accurate as of January 15, 2019  4:21 PM. Always use your most recent med list.        acyclovir cream 5 % Commonly known as:  ZOVIRAX Apply topically.   aspirin EC 81 MG tablet Take 81 mg by mouth daily.   atorvastatin 80 MG tablet Commonly known as:  LIPITOR Take 80 mg by mouth daily. Pt taking differently. Takes 160mg  daily   azelastine 0.1 % nasal spray Commonly known as:  ASTELIN Place into the nose.   cetirizine 10 MG tablet Commonly known as:  ZYRTEC Take 10 mg by mouth daily.   clonazePAM 0.5 MG tablet Commonly known as:  KLONOPIN   clotrimazole-betamethasone cream Commonly known as:  LOTRISONE Apply 1 application topically 2 (two) times daily.   DULoxetine 60 MG capsule Commonly known as:  CYMBALTA Take 60 mg by mouth daily.   escitalopram 20 MG tablet Commonly known as:  LEXAPRO Take 20 mg by mouth daily.   estradiol 0.1 MG/GM vaginal cream Commonly known as:  ESTRACE Apply 0.5 mg with fingertip to just inside the vaginal introitus on Monday, Wednesday and Friday nights   famciclovir 500 MG tablet Commonly known as:  FAMVIR Take 500 mg by mouth 3 (three) times daily. As needed for outbreak for duration of 5 days   fluticasone  50 MCG/ACT nasal spray Commonly known as:  FLONASE Place into the nose.   glucagon 1 MG injection Inject 1 mg into the muscle once as needed (as needed for low blood sugar).   HUMALOG KWIKPEN 100 UNIT/ML KwikPen Generic drug:  insulin lispro   hydrOXYzine 50 MG tablet Commonly known as:  ATARAX/VISTARIL   ibuprofen 800 MG tablet Commonly known as:  ADVIL,MOTRIN   JARDIANCE 25 MG Tabs tablet Generic drug:  empagliflozin Take 25 mg by mouth daily.   LANTUS SOLOSTAR 100 UNIT/ML Solostar Pen Generic drug:  Insulin Glargine Inject 45 Units into the skin daily.   LEVEMIR FLEXTOUCH 100 UNIT/ML Pen Generic  drug:  Insulin Detemir   levothyroxine 25 MCG tablet Commonly known as:  SYNTHROID, LEVOTHROID Take 25 mcg by mouth daily before breakfast.   lisinopril 5 MG tablet Commonly known as:  PRINIVIL,ZESTRIL   metFORMIN 1000 MG tablet Commonly known as:  GLUCOPHAGE Take 2,000 mg by mouth daily.   methocarbamol 500 MG tablet Commonly known as:  ROBAXIN   ranitidine 150 MG capsule Commonly known as:  ZANTAC Take 150 mg by mouth 2 (two) times daily.   traZODone 100 MG tablet Commonly known as:  DESYREL Take 200 mg by mouth at bedtime.   TRULICITY 0.96 GE/3.6OQ Sopn Generic drug:  Dulaglutide       Allergies:  Allergies  Allergen Reactions  . Codeine   . Dilaudid [Hydromorphone Hcl] Itching  . Sulfa Antibiotics     Family History: Family History  Problem Relation Age of Onset  . Dementia Mother   . CAD Mother   . Diabetes Mother   . Hypertension Mother   . Osteoarthritis Mother   . Cancer Father   . Cirrhosis Father   . Kidney cancer Neg Hx   . Kidney disease Neg Hx   . Prostate cancer Neg Hx     Social History:  reports that she has never smoked. She has never used smokeless tobacco. She reports current alcohol use. She reports that she does not use drugs.  ROS: UROLOGY Frequent Urination?: No Hard to postpone urination?: Yes Burning/pain with  urination?: No Get up at night to urinate?: Yes Leakage of urine?: No Urine stream starts and stops?: No Trouble starting stream?: No Do you have to strain to urinate?: No Blood in urine?: No Urinary tract infection?: No Sexually transmitted disease?: No Injury to kidneys or bladder?: Yes Painful intercourse?: Yes Weak stream?: No Currently pregnant?: No Vaginal bleeding?: No Last menstrual period?: n  Gastrointestinal Nausea?: No Vomiting?: No Indigestion/heartburn?: No Diarrhea?: No Constipation?: No  Constitutional Fever: No Night sweats?: Yes Weight loss?: No Fatigue?: No  Skin Skin rash/lesions?: No Itching?: No  Eyes Blurred vision?: No Double vision?: No  Ears/Nose/Throat Sore throat?: No Sinus problems?: No  Hematologic/Lymphatic Swollen glands?: No Easy bruising?: No  Cardiovascular Leg swelling?: No Chest pain?: No  Respiratory Cough?: No Shortness of breath?: No  Endocrine Excessive thirst?: Yes  Musculoskeletal Back pain?: No Joint pain?: No  Neurological Headaches?: No Dizziness?: No  Psychologic Depression?: Yes Anxiety?: Yes  Physical Exam: BP 124/82 (BP Location: Left Arm, Patient Position: Sitting)   Pulse 90   Ht 5' (1.524 m)   Wt 196 lb (88.9 kg)   BMI 38.28 kg/m   Constitutional: Well nourished. Alert and oriented, No acute distress. Cardiovascular: No clubbing, cyanosis, or edema. Respiratory: Normal respiratory effort, no increased work of breathing. Skin: No rashes, bruises or suspicious lesions. Neurologic: Grossly intact, no focal deficits, moving all 4 extremities. Psychiatric: Normal mood and affect.   Laboratory Data: Lab Results  Component Value Date   WBC 7.0 04/13/2018   HGB 13.5 04/13/2018   HCT 39.3 04/13/2018   MCV 88.5 04/13/2018   PLT 184 04/13/2018    Lab Results  Component Value Date   CREATININE 0.67 04/13/2018   I have reviewed the labs.  Pertinent Imaging:  Results for orders  placed or performed in visit on 01/15/19  BLADDER SCAN AMB NON-IMAGING  Result Value Ref Range   Scan Result 84ml    I have independently reviewed the films.    Assessment & Plan:    1.  Urge Incontinence - Could not tolerate the OAB medications - Completed physical therapy - did not find it helpful - Completed PTNS; it was moderately but incompletely ineffective - Patient wishes to consult Dr. Matilde Sprang about other options    2. Vaginal atrophy - Continue the vaginal estrogen cream  Return for appointment with Dr. Matilde Sprang .  Zara Council, PA-C  [facility] [address] [place], [place] [address] [phone]  I, Adele Schilder, am acting as a Education administrator for [employer], PA-C.   I have reviewed the above documentation for accuracy and completeness, and I agree with the above.    Zara Council, PA-C

## 2019-01-15 ENCOUNTER — Ambulatory Visit: Payer: Medicare HMO | Admitting: Urology

## 2019-01-15 ENCOUNTER — Encounter: Payer: Self-pay | Admitting: Urology

## 2019-01-15 VITALS — BP 124/82 | HR 90 | Ht 60.0 in | Wt 196.0 lb

## 2019-01-15 DIAGNOSIS — N3941 Urge incontinence: Secondary | ICD-10-CM | POA: Diagnosis not present

## 2019-01-15 DIAGNOSIS — N952 Postmenopausal atrophic vaginitis: Secondary | ICD-10-CM | POA: Diagnosis not present

## 2019-01-15 LAB — BLADDER SCAN AMB NON-IMAGING

## 2019-02-03 ENCOUNTER — Ambulatory Visit: Payer: Self-pay | Admitting: Urology

## 2019-04-18 ENCOUNTER — Other Ambulatory Visit: Payer: Self-pay | Admitting: Family Medicine

## 2019-04-18 DIAGNOSIS — R1011 Right upper quadrant pain: Secondary | ICD-10-CM

## 2019-04-29 ENCOUNTER — Ambulatory Visit: Payer: Medicare Other

## 2019-04-30 ENCOUNTER — Other Ambulatory Visit: Payer: Self-pay

## 2019-04-30 ENCOUNTER — Ambulatory Visit
Admission: RE | Admit: 2019-04-30 | Discharge: 2019-04-30 | Disposition: A | Discharge status: Home / Self Care | Payer: Medicare Other | Source: Ambulatory Visit | Attending: Family Medicine | Admitting: Family Medicine

## 2019-04-30 DIAGNOSIS — R1011 Right upper quadrant pain: Secondary | ICD-10-CM | POA: Insufficient documentation

## 2019-05-05 ENCOUNTER — Ambulatory Visit: Payer: Medicare HMO | Admitting: Urology

## 2019-05-19 ENCOUNTER — Ambulatory Visit (INDEPENDENT_AMBULATORY_CARE_PROVIDER_SITE_OTHER): Payer: Medicare Other | Admitting: Urology

## 2019-05-19 ENCOUNTER — Encounter: Payer: Self-pay | Admitting: Urology

## 2019-05-19 ENCOUNTER — Other Ambulatory Visit: Payer: Self-pay

## 2019-05-19 VITALS — BP 141/86 | HR 74 | Ht 60.0 in | Wt 197.0 lb

## 2019-05-19 DIAGNOSIS — N3946 Mixed incontinence: Secondary | ICD-10-CM | POA: Diagnosis not present

## 2019-05-19 NOTE — Progress Notes (Addendum)
05/19/2019 11:23 AM   Taylor Thompson 11/30/1955 [id number]  Referring provider: Zeb Comfort, MD [place],  [place] [address]  Chief Complaint  Patient presents with  . Urinary Incontinence    Follow up    HPI: She has been assessed by our nurse practitioner for urinary incontinence beginning after a fall in January 2019.  She did not tolerate Myrbetriq or Toviaz.  It appears that she has had a TIA and uses CPAP.  She failed percutaneous tibial nerve stimulation.  She failed physical therapy  Patient leaks with coughing standing and lifting.  She has urge incontinence.  She does not wear a pad.  At times she might leak minimally while she sleeps.  She voids every 1 or 2 hours gets up once or twice at night.  She has no loss of vaginal or rectal sensation  Modifying factors: There are no other modifying factors  Associated signs and symptoms: There are no other associated signs and symptoms Aggravating and relieving factors: There are no other aggravating or relieving factors Severity: Moderate Duration: Persistent   PMH: Past Medical History:  Diagnosis Date  . Acid reflux   . Alopecia 2012  . Anxiety   . Arthritis   . Depression   . Diabetes mellitus without complication ([place])   . Diverticulitis   . Hyperlipidemia   . Hypothyroidism   . Seizures ([place]) 2009  . Skin cancer   . Sleep apnea   . Thyroid disease     Surgical History: Past Surgical History:  Procedure Laterality Date  . ABDOMINAL HYSTERECTOMY  1986   Vaginal  . ABDOMINAL HYSTERECTOMY    . CHOLECYSTECTOMY  2009  . COLONOSCOPY WITH PROPOFOL N/A 05/24/2018   Procedure: COLONOSCOPY WITH PROPOFOL;  Surgeon: Lollie Sails, MD;  Location[facility] ENDOSCOPY;  Service: Endoscopy;  Laterality: N/A;  . SQUAMOUS CELL CARCINOMA EXCISION  2014    Home Medications:  Allergies as of 05/19/2019      Reactions   Codeine    Dilaudid [hydromorphone Hcl] Itching   Sulfa Antibiotics    Poison Ivy Extract Itching, Rash      Medication List       Accurate as of May 19, 2019 11:23 AM. If you have any questions, ask your nurse or doctor.        STOP taking these medications   escitalopram 20 MG tablet Commonly known as: LEXAPRO Stopped by: Reece Packer, MD   glucagon 1 MG injection Stopped by: Reece Packer, MD   hydrOXYzine 50 MG tablet Commonly known as: ATARAX/VISTARIL Stopped by: Reece Packer, MD   ranitidine 150 MG capsule Commonly known as: ZANTAC Stopped by: Reece Packer, MD   Trulicity 6.60 YT/0.1SW Sopn Generic drug: Dulaglutide Stopped by: Reece Packer, MD     TAKE these medications   acyclovir cream 5 % Commonly known as: ZOVIRAX Apply topically.   aspirin EC 81 MG tablet Take 81 mg by mouth daily.   atorvastatin 80 MG tablet Commonly known as: LIPITOR Take 80 mg by mouth daily. Pt taking differently. Takes 160mg  daily   azelastine 0.1 % nasal spray Commonly known as: ASTELIN Place into the nose.   cetirizine 10 MG tablet Commonly known as: ZYRTEC Take 10 mg by mouth daily.   clonazePAM 0.5 MG tablet Commonly known as: KLONOPIN   clotrimazole-betamethasone cream Commonly known as: LOTRISONE Apply 1 application topically 2 (two) times daily.   DULoxetine 60 MG capsule  Commonly known as: CYMBALTA Take 60 mg by mouth daily.   estradiol 0.1 MG/GM vaginal cream Commonly known as: ESTRACE Apply 0.5 mg with fingertip to just inside the vaginal introitus on Monday, Wednesday and Friday nights   famciclovir 500 MG tablet Commonly known as: FAMVIR Take 500 mg by mouth 3 (three) times daily. As needed for outbreak for duration of 5 days   fluticasone 50 MCG/ACT nasal spray Commonly known as: FLONASE Place into the nose.   HumaLOG KwikPen 100 UNIT/ML KwikPen Generic drug: insulin lispro   ibuprofen 800 MG tablet Commonly known as: ADVIL   Jardiance 25 MG Tabs tablet Generic drug: empagliflozin  Take 25 mg by mouth daily.   Lantus SoloStar 100 UNIT/ML Solostar Pen Generic drug: Insulin Glargine Inject 45 Units into the skin daily.   Levemir FlexTouch 100 UNIT/ML Pen Generic drug: Insulin Detemir   levothyroxine 25 MCG tablet Commonly known as: SYNTHROID Take 25 mcg by mouth daily before breakfast.   lisinopril 5 MG tablet Commonly known as: ZESTRIL   metFORMIN 1000 MG tablet Commonly known as: GLUCOPHAGE Take 2,000 mg by mouth daily.   methocarbamol 500 MG tablet Commonly known as: ROBAXIN   traZODone 100 MG tablet Commonly known as: DESYREL Take 200 mg by mouth at bedtime.       Allergies:  Allergies  Allergen Reactions  . Codeine   . Dilaudid [Hydromorphone Hcl] Itching  . Sulfa Antibiotics   . Poison Ivy Extract Itching and Rash    Family History: Family History  Problem Relation Age of Onset  . Dementia Mother   . CAD Mother   . Diabetes Mother   . Hypertension Mother   . Osteoarthritis Mother   . Cancer Father   . Cirrhosis Father   . Kidney cancer Neg Hx   . Kidney disease Neg Hx   . Prostate cancer Neg Hx     Social History:  reports that she has never smoked. She has never used smokeless tobacco. She reports current alcohol use. She reports that she does not use drugs.  ROS: UROLOGY Frequent Urination?: No Hard to postpone urination?: Yes Burning/pain with urination?: No Get up at night to urinate?: Yes Leakage of urine?: Yes Urine stream starts and stops?: No Trouble starting stream?: No Do you have to strain to urinate?: No Blood in urine?: No Urinary tract infection?: No Sexually transmitted disease?: No Injury to kidneys or bladder?: No Painful intercourse?: Yes Weak stream?: Yes Currently pregnant?: Yes Vaginal bleeding?: Yes Last menstrual period?: n  Gastrointestinal Nausea?: Yes Vomiting?: No Indigestion/heartburn?: No Diarrhea?: No Constipation?: No  Constitutional Fever: No Night sweats?: No Weight  loss?: No Fatigue?: No  Skin Skin rash/lesions?: No Itching?: No  Eyes Blurred vision?: No  Ears/Nose/Throat Sore throat?: No Sinus problems?: No  Hematologic/Lymphatic Swollen glands?: No Easy bruising?: No  Cardiovascular Leg swelling?: No Chest pain?: No  Respiratory Cough?: No Shortness of breath?: No  Endocrine Excessive thirst?: Yes  Musculoskeletal Back pain?: No Joint pain?: No  Neurological Headaches?: No Dizziness?: No  Psychologic Depression?: Yes Anxiety?: Yes  Physical Exam: BP (!) 141/86   Pulse 74   Ht 5' (1.524 m)   Wt 197 lb (89.4 kg)   BMI 38.47 kg/m   Constitutional:  Alert and oriented, No acute distress. HEENT: [place] AT, moist mucus membranes.  Trachea midline, no masses. Cardiovascular: No clubbing, cyanosis, or edema. Respiratory: Normal respiratory effort, no increased work of breathing. GI: Abdomen is soft, nontender, nondistended, no abdominal masses  GU: Pelvic examination the patient had a well supported bladder neck with no stress incontinence with a light cough and no prolapse.  She had mild narrowing of the vagina Skin: No rashes, bruises or suspicious lesions. Lymph: No cervical or inguinal adenopathy. Neurologic: Grossly intact, no focal deficits, moving all 4 extremities. Psychiatric: Normal mood and affect.  Laboratory Data: Lab Results  Component Value Date   WBC 7.0 04/13/2018   HGB 13.5 04/13/2018   HCT 39.3 04/13/2018   MCV 88.5 04/13/2018   PLT 184 04/13/2018    Lab Results  Component Value Date   CREATININE 0.67 04/13/2018    No results found for: PSA  No results found for: TESTOSTERONE  No results found for: HGBA1C  Urinalysis    Component Value Date/Time   COLORURINE YELLOW (A) 04/13/2018 1148   APPEARANCEUR CLOUDY (A) 04/13/2018 1148   LABSPEC 1.020 04/13/2018 1148   PHURINE 5.0 04/13/2018 1148   GLUCOSEU NEGATIVE 04/13/2018 1148   HGBUR NEGATIVE 04/13/2018 1148   BILIRUBINUR NEGATIVE  04/13/2018 1148   KETONESUR 5 (A) 04/13/2018 1148   PROTEINUR NEGATIVE 04/13/2018 1148   NITRITE NEGATIVE 04/13/2018 1148   LEUKOCYTESUR SMALL (A) 04/13/2018 1148    Pertinent Imaging:   Assessment & Plan: Patient has mild mixed incontinence.  The role of urodynamics discussed.  She does have risk factors for neurogenic bladder thought it was safest to recommend the test as well.  Botox and InterStim may certainly be not ideal because of her mild symptoms.  Understands the concept of bladder pressures.  I did not mention refractory therapies yet.    There are no diagnoses linked to this encounter.  No follow-ups on file.  Reece Packer, MD  [place] [address], [place], [place] [address] [phone]

## 2019-05-22 ENCOUNTER — Other Ambulatory Visit
Admission: RE | Admit: 2019-05-22 | Discharge: 2019-05-22 | Disposition: A | Discharge status: Home / Self Care | Payer: Medicare Other | Source: Ambulatory Visit | Attending: Gastroenterology | Admitting: Gastroenterology

## 2019-05-22 ENCOUNTER — Other Ambulatory Visit: Payer: Self-pay

## 2019-05-22 DIAGNOSIS — Z1159 Encounter for screening for other viral diseases: Secondary | ICD-10-CM | POA: Diagnosis not present

## 2019-05-22 DIAGNOSIS — Z01812 Encounter for preprocedural laboratory examination: Secondary | ICD-10-CM | POA: Diagnosis present

## 2019-05-23 LAB — SARS CORONAVIRUS 2 (TAT 6-24 HRS): SARS Coronavirus 2: NEGATIVE

## 2019-05-26 ENCOUNTER — Other Ambulatory Visit: Payer: Self-pay

## 2019-05-26 ENCOUNTER — Ambulatory Visit
Admission: RE | Admit: 2019-05-26 | Discharge: 2019-05-26 | Disposition: A | Discharge status: Home / Self Care | Payer: Medicare Other | Attending: Gastroenterology | Admitting: Gastroenterology

## 2019-05-26 ENCOUNTER — Encounter: Payer: Self-pay | Admitting: *Deleted

## 2019-05-26 ENCOUNTER — Ambulatory Visit: Payer: Medicare Other | Admitting: Anesthesiology

## 2019-05-26 ENCOUNTER — Encounter
Admission: RE | Disposition: A | Discharge status: Home / Self Care | Payer: Self-pay | Source: Home / Self Care | Attending: Gastroenterology

## 2019-05-26 DIAGNOSIS — E119 Type 2 diabetes mellitus without complications: Secondary | ICD-10-CM | POA: Diagnosis not present

## 2019-05-26 DIAGNOSIS — Z79890 Hormone replacement therapy (postmenopausal): Secondary | ICD-10-CM | POA: Diagnosis not present

## 2019-05-26 DIAGNOSIS — K210 Gastro-esophageal reflux disease with esophagitis: Secondary | ICD-10-CM | POA: Insufficient documentation

## 2019-05-26 DIAGNOSIS — I10 Essential (primary) hypertension: Secondary | ICD-10-CM | POA: Diagnosis not present

## 2019-05-26 DIAGNOSIS — K449 Diaphragmatic hernia without obstruction or gangrene: Secondary | ICD-10-CM | POA: Diagnosis not present

## 2019-05-26 DIAGNOSIS — Z7982 Long term (current) use of aspirin: Secondary | ICD-10-CM | POA: Diagnosis not present

## 2019-05-26 DIAGNOSIS — F419 Anxiety disorder, unspecified: Secondary | ICD-10-CM | POA: Insufficient documentation

## 2019-05-26 DIAGNOSIS — G4730 Sleep apnea, unspecified: Secondary | ICD-10-CM | POA: Diagnosis not present

## 2019-05-26 DIAGNOSIS — R1013 Epigastric pain: Secondary | ICD-10-CM | POA: Diagnosis present

## 2019-05-26 DIAGNOSIS — R569 Unspecified convulsions: Secondary | ICD-10-CM | POA: Diagnosis not present

## 2019-05-26 DIAGNOSIS — K921 Melena: Secondary | ICD-10-CM | POA: Insufficient documentation

## 2019-05-26 DIAGNOSIS — K589 Irritable bowel syndrome without diarrhea: Secondary | ICD-10-CM | POA: Diagnosis not present

## 2019-05-26 DIAGNOSIS — E039 Hypothyroidism, unspecified: Secondary | ICD-10-CM | POA: Diagnosis not present

## 2019-05-26 DIAGNOSIS — M1990 Unspecified osteoarthritis, unspecified site: Secondary | ICD-10-CM | POA: Diagnosis not present

## 2019-05-26 DIAGNOSIS — K319 Disease of stomach and duodenum, unspecified: Secondary | ICD-10-CM | POA: Diagnosis not present

## 2019-05-26 DIAGNOSIS — K228 Other specified diseases of esophagus: Secondary | ICD-10-CM | POA: Diagnosis not present

## 2019-05-26 DIAGNOSIS — Z8673 Personal history of transient ischemic attack (TIA), and cerebral infarction without residual deficits: Secondary | ICD-10-CM | POA: Insufficient documentation

## 2019-05-26 DIAGNOSIS — Z882 Allergy status to sulfonamides status: Secondary | ICD-10-CM | POA: Diagnosis not present

## 2019-05-26 DIAGNOSIS — E785 Hyperlipidemia, unspecified: Secondary | ICD-10-CM | POA: Insufficient documentation

## 2019-05-26 DIAGNOSIS — Z85828 Personal history of other malignant neoplasm of skin: Secondary | ICD-10-CM | POA: Insufficient documentation

## 2019-05-26 DIAGNOSIS — Z794 Long term (current) use of insulin: Secondary | ICD-10-CM | POA: Insufficient documentation

## 2019-05-26 DIAGNOSIS — F329 Major depressive disorder, single episode, unspecified: Secondary | ICD-10-CM | POA: Insufficient documentation

## 2019-05-26 DIAGNOSIS — K219 Gastro-esophageal reflux disease without esophagitis: Secondary | ICD-10-CM | POA: Diagnosis not present

## 2019-05-26 DIAGNOSIS — Z79899 Other long term (current) drug therapy: Secondary | ICD-10-CM | POA: Diagnosis not present

## 2019-05-26 DIAGNOSIS — K2960 Other gastritis without bleeding: Secondary | ICD-10-CM | POA: Insufficient documentation

## 2019-05-26 DIAGNOSIS — Z885 Allergy status to narcotic agent status: Secondary | ICD-10-CM | POA: Insufficient documentation

## 2019-05-26 HISTORY — DX: Irritable bowel syndrome, unspecified: K58.9

## 2019-05-26 HISTORY — DX: Transient cerebral ischemic attack, unspecified: G45.9

## 2019-05-26 HISTORY — DX: Panic disorder (episodic paroxysmal anxiety): F41.0

## 2019-05-26 HISTORY — DX: Essential (primary) hypertension: I10

## 2019-05-26 HISTORY — PX: ESOPHAGOGASTRODUODENOSCOPY (EGD) WITH PROPOFOL: SHX5813

## 2019-05-26 HISTORY — DX: Diverticulosis of intestine, part unspecified, without perforation or abscess without bleeding: K57.90

## 2019-05-26 HISTORY — DX: Fatty (change of) liver, not elsewhere classified: K76.0

## 2019-05-26 LAB — GLUCOSE, CAPILLARY: Glucose-Capillary: 238 mg/dL — ABNORMAL HIGH (ref 70–99)

## 2019-05-26 SURGERY — ESOPHAGOGASTRODUODENOSCOPY (EGD) WITH PROPOFOL
Anesthesia: General

## 2019-05-26 MED ORDER — PROPOFOL 10 MG/ML IV BOLUS
INTRAVENOUS | Status: DC | PRN
  Administered 2019-05-26: 40 mg via INTRAVENOUS

## 2019-05-26 MED ORDER — PROPOFOL 500 MG/50ML IV EMUL
INTRAVENOUS | Status: DC | PRN
  Administered 2019-05-26: 150 ug/kg/min via INTRAVENOUS

## 2019-05-26 MED ORDER — SODIUM CHLORIDE 0.9 % IV SOLN
INTRAVENOUS | Status: DC
  Administered 2019-05-26: 10:00:00 via INTRAVENOUS

## 2019-05-26 MED ORDER — PROPOFOL 500 MG/50ML IV EMUL
INTRAVENOUS | Status: AC
  Filled 2019-05-26: qty 50

## 2019-05-26 NOTE — Anesthesia Post-op Follow-up Note (Signed)
Anesthesia QCDR form completed.        

## 2019-05-26 NOTE — H&P (Signed)
Outpatient short stay form Pre-procedure 05/26/2019 10:11 AM Lollie Sails MD  Primary Physician: Dr. Hortencia Pilar  Reason for visit: EGD  History of present illness: Patient is a 63 year old female presenting today for an EGD in regards to her recent history of seeing some black stools and having some epigastric/right upper quadrant pain.  She does have a history of cholecystectomy a number of years ago.  When she came to see Ms. London she was placed on a proton pump inhibitor and has felt much better since then.  She has not seen any black stools last couple weeks.  She denies the use of any aspirin or NSAID products with the exception of 81 mg aspirin daily.  She last took that yesterday.  Takes no blood thinners.    Current Facility-Administered Medications:  .  0.9 %  sodium chloride infusion, , Intravenous, Continuous, Lollie Sails, MD, Last Rate: 20 mL/hr at 05/26/19 1010  Medications Prior to Admission  Medication Sig Dispense Refill Last Dose  . aspirin EC 81 MG tablet Take 81 mg by mouth daily.   05/25/2019 at Unknown time  . atorvastatin (LIPITOR) 80 MG tablet Take 80 mg by mouth daily. Pt taking differently. Takes 160mg  daily   05/25/2019 at Unknown time  . clonazePAM (KLONOPIN) 0.5 MG tablet    05/25/2019 at Unknown time  . DULoxetine (CYMBALTA) 60 MG capsule Take 60 mg by mouth daily.  0 05/25/2019 at Unknown time  . insulin aspart protamine- aspart (NOVOLOG MIX 70/30) (70-30) 100 UNIT/ML injection Inject 50 Units into the skin 2 (two) times daily with a meal.   Past Week at Unknown time  . JARDIANCE 25 MG TABS tablet Take 25 mg by mouth daily.  3 05/25/2019 at Unknown time  . levothyroxine (SYNTHROID, LEVOTHROID) 25 MCG tablet Take 25 mcg by mouth daily before breakfast.   05/25/2019 at Unknown time  . lisinopril (PRINIVIL,ZESTRIL) 5 MG tablet    05/25/2019 at Unknown time  . metFORMIN (GLUCOPHAGE) 1000 MG tablet Take 2,000 mg by mouth daily.   05/25/2019 at Unknown time   . pantoprazole (PROTONIX) 40 MG tablet Take 40 mg by mouth daily.   05/25/2019 at Unknown time  . traZODone (DESYREL) 100 MG tablet Take 200 mg by mouth at bedtime.    05/25/2019 at Unknown time  . acyclovir cream (ZOVIRAX) 5 % Apply topically.     Marland Kitchen azelastine (ASTELIN) 0.1 % nasal spray Place into the nose.     . cetirizine (ZYRTEC) 10 MG tablet Take 10 mg by mouth daily.     . clotrimazole-betamethasone (LOTRISONE) cream Apply 1 application topically 2 (two) times daily.     Marland Kitchen estradiol (ESTRACE) 0.1 MG/GM vaginal cream Apply 0.5 mg with fingertip to just inside the vaginal introitus on Monday, Wednesday and Friday nights 30 g 12   . famciclovir (FAMVIR) 500 MG tablet Take 500 mg by mouth 3 (three) times daily. As needed for outbreak for duration of 5 days     . fluticasone (FLONASE) 50 MCG/ACT nasal spray Place into the nose.     Marland Kitchen HUMALOG KWIKPEN 100 UNIT/ML KiwkPen      . ibuprofen (ADVIL,MOTRIN) 800 MG tablet      . LANTUS SOLOSTAR 100 UNIT/ML Solostar Pen Inject 45 Units into the skin daily.      Marland Kitchen LEVEMIR FLEXTOUCH 100 UNIT/ML Pen      . methocarbamol (ROBAXIN) 500 MG tablet    Completed Course at Unknown time  Allergies  Allergen Reactions  . Codeine   . Sulfa Antibiotics   . Dilaudid [Hydromorphone Hcl] Itching  . Poison Ivy Extract Itching and Rash     Past Medical History:  Diagnosis Date  . Acid reflux   . Alopecia 2012  . Anxiety   . Arthritis   . Depression   . Diabetes mellitus without complication ([place])   . Diverticulitis   . Diverticulosis   . Fatty liver   . Hyperlipidemia   . Hypertension   . Hypothyroidism   . IBS (irritable bowel syndrome)   . Panic disorder   . Seizures ([place]) 2009  . Skin cancer   . Sleep apnea   . Thyroid disease   . TIA (transient ischemic attack)     Review of systems:      Physical Exam    Heart and lungs: Regular rate and rhythm without rub or gallop, lungs are bilaterally clear    HEENT: Normocephalic atraumatic  eyes are anicteric    Other:    Pertinant exam for procedure: Soft nontender nondistended bowel sounds positive normoactive    Planned proceedures: EGD and indicated procedures. I have discussed the risks benefits and complications of procedures to include not limited to bleeding, infection, perforation and the risk of sedation and the patient wishes to proceed.    Lollie Sails, MD Gastroenterology 05/26/2019  10:11 AM

## 2019-05-26 NOTE — Transfer of Care (Signed)
Immediate Anesthesia Transfer of Care Note  Patient: Taylor Thompson  Procedure(s) Performed: ESOPHAGOGASTRODUODENOSCOPY (EGD) WITH PROPOFOL (N/A )  Patient Location: PACU  Anesthesia Type:General  Level of Consciousness: awake, alert , oriented and drowsy  Airway & Oxygen Therapy: Patient Spontanous Breathing and Patient connected to nasal cannula oxygen  Post-op Assessment: Report given to RN and Post -op Vital signs reviewed and stable  Post vital signs: Reviewed and stable  Last Vitals:  Vitals Value Taken Time  BP    Temp    Pulse    Resp    SpO2      Last Pain:  Vitals:   05/26/19 1037  TempSrc: Tympanic  PainSc:          Complications: No apparent anesthesia complications

## 2019-05-26 NOTE — Anesthesia Preprocedure Evaluation (Signed)
Anesthesia Evaluation  Patient identified by MRN, date of birth, ID band Patient awake    Reviewed: Allergy & Precautions, H&P , NPO status , Patient's Chart, lab work & pertinent test results  History of Anesthesia Complications Negative for: history of anesthetic complications  Airway Mallampati: III  TM Distance: <3 FB Neck ROM: limited    Dental  (+) Chipped   Pulmonary neg shortness of breath, sleep apnea and Continuous Positive Airway Pressure Ventilation ,           Cardiovascular Exercise Tolerance: Good hypertension, (-) angina(-) Past MI and (-) DOE      Neuro/Psych Seizures -,  PSYCHIATRIC DISORDERS TIACVA    GI/Hepatic Neg liver ROS, GERD  Medicated and Controlled,  Endo/Other  diabetes, Type 2, Insulin DependentHypothyroidism   Renal/GU negative Renal ROS  negative genitourinary   Musculoskeletal  (+) Arthritis ,   Abdominal   Peds  Hematology negative hematology ROS (+)   Anesthesia Other Findings Past Medical History: No date: Acid reflux 2012: Alopecia No date: Anxiety No date: Arthritis No date: Depression No date: Diabetes mellitus without complication (HCC) No date: Diverticulitis No date: Diverticulosis No date: Fatty liver No date: Hyperlipidemia No date: Hypertension No date: Hypothyroidism No date: IBS (irritable bowel syndrome) No date: Panic disorder 2009: Seizures ([place]) No date: Skin cancer No date: Sleep apnea No date: Thyroid disease No date: TIA (transient ischemic attack)  Past Surgical History: 1986: ABDOMINAL HYSTERECTOMY     Comment:  Vaginal No date: ABDOMINAL HYSTERECTOMY 2009: CHOLECYSTECTOMY 05/24/2018: COLONOSCOPY WITH PROPOFOL; N/A     Comment:  Procedure: COLONOSCOPY WITH PROPOFOL;  Surgeon:               Lollie Sails, MD;  Location: [facility] ENDOSCOPY;                Service: Endoscopy;  Laterality: N/A; 2014: SQUAMOUS CELL CARCINOMA EXCISION  BMI    Body Mass Index: 38.47 kg/m      Reproductive/Obstetrics negative OB ROS                             Anesthesia Physical Anesthesia Plan  ASA: III  Anesthesia Plan: General   Post-op Pain Management:    Induction: Intravenous  PONV Risk Score and Plan: Propofol infusion and TIVA  Airway Management Planned: Natural Airway and Nasal Cannula  Additional Equipment:   Intra-op Plan:   Post-operative Plan:   Informed Consent: I have reviewed the patients History and Physical, chart, labs and discussed the procedure including the risks, benefits and alternatives for the proposed anesthesia with the patient or authorized representative who has indicated his/her understanding and acceptance.     Dental Advisory Given  Plan Discussed with: Anesthesiologist, CRNA and Surgeon  Anesthesia Plan Comments: (Patient consented for risks of anesthesia including but not limited to:  - adverse reactions to medications - risk of intubation if required - damage to teeth, lips or other oral mucosa - sore throat or hoarseness - Damage to heart, brain, lungs or loss of life  Patient voiced understanding.)        Anesthesia Quick Evaluation

## 2019-05-26 NOTE — Op Note (Addendum)
[facility] Gastroenterology Patient Name: Taylor Thompson Procedure Date: 05/26/2019 10:16 AM MRN: [id number] Account #: [id number] Date of Birth: June 23, 1956 Admit Type: Outpatient Age: 63 Room: [facility] ENDO ROOM 3 Gender: Female Note Status: Finalized Procedure:            Upper GI endoscopy Indications:          Epigastric abdominal pain, Abdominal pain in the right                        upper quadrant, Melena Providers:            Lollie Sails, MD Referring MD:         No Local Md, MD (Referring MD) Medicines:            Monitored Anesthesia Care Complications:        No immediate complications. Procedure:            Pre-Anesthesia Assessment:                       - ASA Grade Assessment: III - A patient with severe                        systemic disease.                       After obtaining informed consent, the endoscope was                        passed under direct vision. Throughout the procedure,                        the patient's blood pressure, pulse, and oxygen                        saturations were monitored continuously. The Endoscope                        was introduced through the mouth, and advanced to the                        third part of duodenum. The upper GI endoscopy was                        accomplished without difficulty. The patient tolerated                        the procedure well. Findings:      The Z-line was variable. Biopsies were taken with a cold forceps for       histology.      A small hiatal hernia was found. The Z-line was a variable distance from       incisors; the hiatal hernia was sliding.      Patchy mild inflammation characterized by congestion (edema) and       erythema was found in the gastric body. Biopsies were taken with a cold       forceps for histology. Biopsies were taken with a cold forceps for       Helicobacter pylori testing.      Patchy mild inflammation characterized by congestion  (edema), erosions       and erythema was found  in the gastric antrum. Biopsies were taken with a       cold forceps for histology. Biopsies were taken with a cold forceps for       Helicobacter pylori testing.      The cardia and gastric fundus were normal on retroflexion otherwise.      The examined duodenum was normal. Impression:           - Z-line variable. Biopsied.                       - Small hiatal hernia.                       - Gastritis. Biopsied.                       - Erosive gastritis. Biopsied.                       - Normal examined duodenum. Recommendation:       - Return to GI clinic in 3 weeks. Procedure Code(s):    --- Professional ---                       [phone], Esophagogastroduodenoscopy, flexible, transoral;                        with biopsy, single or multiple Diagnosis Code(s):    --- Professional ---                       K22.8, Other specified diseases of esophagus                       K44.9, Diaphragmatic hernia without obstruction or                        gangrene                       K29.70, Gastritis, unspecified, without bleeding                       K29.60, Other gastritis without bleeding                       R10.13, Epigastric pain                       R10.11, Right upper quadrant pain                       K92.1, Melena (includes Hematochezia) CPT copyright 2019 American Medical Association. All rights reserved. The codes documented in this report are preliminary and upon coder review may  be revised to meet current compliance requirements. Lollie Sails, MD 05/26/2019 10:37:54 AM This report has been signed electronically. Number of Addenda: 0 Note Initiated On: 05/26/2019 10:16 AM      [facility]

## 2019-05-27 ENCOUNTER — Encounter: Payer: Self-pay | Admitting: Gastroenterology

## 2019-05-27 NOTE — Anesthesia Postprocedure Evaluation (Signed)
Anesthesia Post Note  Patient: Taylor Thompson  Procedure(s) Performed: ESOPHAGOGASTRODUODENOSCOPY (EGD) WITH PROPOFOL (N/A )  Patient location during evaluation: Endoscopy Anesthesia Type: General Level of consciousness: awake and alert Pain management: pain level controlled Vital Signs Assessment: post-procedure vital signs reviewed and stable Respiratory status: spontaneous breathing, nonlabored ventilation, respiratory function stable and patient connected to nasal cannula oxygen Cardiovascular status: blood pressure returned to baseline and stable Postop Assessment: no apparent nausea or vomiting Anesthetic complications: no     Last Vitals:  Vitals:   05/26/19 1057 05/26/19 1107  BP: 139/80 121/86  Pulse:    Resp:    Temp:    SpO2:      Last Pain:  Vitals:   05/26/19 1047  TempSrc:   PainSc: 0-No pain                 Precious Haws Ronit Cranfield

## 2019-05-29 LAB — SURGICAL PATHOLOGY

## 2019-06-11 ENCOUNTER — Other Ambulatory Visit: Payer: Self-pay | Admitting: Urology

## 2019-06-23 ENCOUNTER — Encounter: Payer: Self-pay | Admitting: Urology

## 2019-06-23 ENCOUNTER — Ambulatory Visit (INDEPENDENT_AMBULATORY_CARE_PROVIDER_SITE_OTHER): Payer: Medicare Other | Admitting: Urology

## 2019-06-23 ENCOUNTER — Other Ambulatory Visit: Payer: Self-pay

## 2019-06-23 VITALS — Ht 60.0 in

## 2019-06-23 DIAGNOSIS — N3946 Mixed incontinence: Secondary | ICD-10-CM

## 2019-06-23 MED ORDER — TOLTERODINE TARTRATE ER 4 MG PO CP24: 4.0000 mg | ORAL_CAPSULE | Freq: Every day | ORAL | 11 refills | Status: DC

## 2019-06-23 MED ORDER — OXYBUTYNIN CHLORIDE ER 10 MG PO TB24: 10.0000 mg | ORAL_TABLET | Freq: Every day | ORAL | 11 refills | Status: DC

## 2019-06-23 NOTE — Progress Notes (Signed)
06/23/2019 9:08 AM   Thereasa Parkin Thompson 07/20/1956 [id number]  Referring provider: Zeb Comfort, MD [place],  [place] [address]  Chief Complaint  Patient presents with  . Follow-up    UDS    HPI:  She has been assessed by our nurse practitioner for urinary incontinence beginning after a fall in January 2019.  She did not tolerate Myrbetriq or Toviaz.  It appears that she has had a TIA and uses CPAP.  She failed percutaneous tibial nerve stimulation.  She failed physical therapy  Patient leaks with coughing standing and lifting.  She has urge incontinence.  She does not wear a pad.  At times she might leak minimally while she sleeps.  She voids every 1 or 2 hours gets up once or twice at night.  She has no loss of vaginal or rectal sensation   Pelvic examination the patient had a well supported bladder neck with no stress incontinence with a light cough and no prolapse.  She had mild narrowing of the vagina  Patient has mild mixed incontinence.  The role of urodynamics discussed.  She does have risk factors for neurogenic bladder thought it was safest to recommend the test as well.  Botox and InterStim may certainly be not ideal because of her mild symptoms.  Understands the concept of bladder pressures.  I did not mention refractory therapies yet.  Today Frequency stable.  Incontinence stable. On urodynamics she was catheterized for 20 mL.  Her maximum bladder capacity was 603 mL.  Her bladder was unstable reaching a pressure of 7 cm of water but she did not leak.  At 200 mL her Valsalva leak point pressure was 110 cm of water.  Her cough leak point pressure was 91 cm of water and both were very mild.  Her bladder became more overactive at higher volumes.  During voluntary voiding she voided 550 mL with a max flow 25 mils per second.  Maximum voiding pressure of 35 cm water.  EMG activity was quiet during voiding phase.  Bladder compliance normal.  The  details of the urodynamics are signed and dictated she did have some trouble initiating urination in the lab setting but the voiding phase was very reasonable   PMH: Past Medical History:  Diagnosis Date  . Acid reflux   . Alopecia 2012  . Anxiety   . Arthritis   . Depression   . Diabetes mellitus without complication ([place])   . Diverticulitis   . Diverticulosis   . Fatty liver   . Hyperlipidemia   . Hypertension   . Hypothyroidism   . IBS (irritable bowel syndrome)   . Panic disorder   . Seizures ([place]) 2009  . Skin cancer   . Sleep apnea   . Thyroid disease   . TIA (transient ischemic attack)     Surgical History: Past Surgical History:  Procedure Laterality Date  . ABDOMINAL HYSTERECTOMY  1986   Vaginal  . ABDOMINAL HYSTERECTOMY    . CHOLECYSTECTOMY  2009  . COLONOSCOPY WITH PROPOFOL N/A 05/24/2018   Procedure: COLONOSCOPY WITH PROPOFOL;  Surgeon: Lollie Sails, MD;  Location:  [facility] ENDOSCOPY;  Service: Endoscopy;  Laterality: N/A;  . ESOPHAGOGASTRODUODENOSCOPY (EGD) WITH PROPOFOL N/A 05/26/2019   Procedure: ESOPHAGOGASTRODUODENOSCOPY (EGD) WITH PROPOFOL;  Surgeon: Lollie Sails, MD;  Location: [facility] ENDOSCOPY;  Service: Endoscopy;  Laterality: N/A;  . SQUAMOUS CELL CARCINOMA EXCISION  2014    Home Medications:  Allergies as of 06/23/2019  Reactions   Codeine    Sulfa Antibiotics    Dilaudid [hydromorphone Hcl] Itching   Poison Ivy Extract Itching, Rash      Medication List       Accurate as of June 23, 2019  9:08 AM. If you have any questions, ask your nurse or doctor.        STOP taking these medications   HumaLOG KwikPen 100 UNIT/ML KwikPen Generic drug: insulin lispro Stopped by: Reece Packer, MD   ibuprofen 800 MG tablet Commonly known as: ADVIL Stopped by: Reece Packer, MD   Insulin Lispro Prot & Lispro (75-25) 100 UNIT/ML Kwikpen Commonly known as: HUMALOG 75/25 MIX Stopped by: Reece Packer, MD   Lantus  SoloStar 100 UNIT/ML Solostar Pen Generic drug: Insulin Glargine Stopped by: Reece Packer, MD   Levemir FlexTouch 100 UNIT/ML Pen Generic drug: Insulin Detemir Stopped by: Reece Packer, MD     TAKE these medications   acyclovir cream 5 % Commonly known as: ZOVIRAX Apply topically.   aspirin EC 81 MG tablet Take 81 mg by mouth daily.   atorvastatin 80 MG tablet Commonly known as: LIPITOR Take 80 mg by mouth daily. Pt taking differently. Takes 160mg  daily   azelastine 0.1 % nasal spray Commonly known as: ASTELIN Place into the nose.   cetirizine 10 MG tablet Commonly known as: ZYRTEC Take 10 mg by mouth daily.   citalopram 40 MG tablet Commonly known as: CELEXA Take 40 mg by mouth daily.   clonazePAM 0.5 MG tablet Commonly known as: KLONOPIN   clotrimazole-betamethasone cream Commonly known as: LOTRISONE Apply 1 application topically 2 (two) times daily.   DULoxetine 60 MG capsule Commonly known as: CYMBALTA Take 60 mg by mouth daily.   estradiol 0.1 MG/GM vaginal cream Commonly known as: ESTRACE Apply 0.5 mg with fingertip to just inside the vaginal introitus on Monday, Wednesday and Friday nights   famciclovir 500 MG tablet Commonly known as: FAMVIR Take 500 mg by mouth 3 (three) times daily. As needed for outbreak for duration of 5 days   fluticasone 50 MCG/ACT nasal spray Commonly known as: FLONASE Place into the nose.   INSULIN ASPART PROT & ASPART [place] Inject into the skin 2 (two) times daily with a meal. 70/25 unit/ml What changed: Another medication with the same name was removed. Continue taking this medication, and follow the directions you see here. Changed by: Reece Packer, MD   Jardiance 25 MG Tabs tablet Generic drug: empagliflozin Take 25 mg by mouth daily.   levothyroxine 25 MCG tablet Commonly known as: SYNTHROID Take 25 mcg by mouth daily before breakfast.   lisinopril 5 MG tablet Commonly known as: ZESTRIL    metFORMIN 1000 MG tablet Commonly known as: GLUCOPHAGE Take 2,000 mg by mouth daily.   methocarbamol 500 MG tablet Commonly known as: ROBAXIN   pantoprazole 40 MG tablet Commonly known as: PROTONIX Take 40 mg by mouth daily.   traZODone 100 MG tablet Commonly known as: DESYREL Take 200 mg by mouth at bedtime.       Allergies:  Allergies  Allergen Reactions  . Codeine   . Sulfa Antibiotics   . Dilaudid [Hydromorphone Hcl] Itching  . Poison Ivy Extract Itching and Rash    Family History: Family History  Problem Relation Age of Onset  . Dementia Mother   . CAD Mother   . Diabetes Mother   . Hypertension Mother   . Osteoarthritis Mother   . Cancer  Father   . Cirrhosis Father   . Kidney cancer Neg Hx   . Kidney disease Neg Hx   . Prostate cancer Neg Hx     Social History:  reports that she has never smoked. She has never used smokeless tobacco. She reports current alcohol use. She reports that she does not use drugs.  ROS: UROLOGY Frequent Urination?: No Hard to postpone urination?: Yes Burning/pain with urination?: No Get up at night to urinate?: Yes Leakage of urine?: Yes Urine stream starts and stops?: No Trouble starting stream?: No Do you have to strain to urinate?: No Blood in urine?: No Urinary tract infection?: No Sexually transmitted disease?: No Injury to kidneys or bladder?: No Painful intercourse?: Yes Weak stream?: No Currently pregnant?: No Vaginal bleeding?: No Last menstrual period?: n  Gastrointestinal Nausea?: No Vomiting?: No Indigestion/heartburn?: Yes Diarrhea?: No Constipation?: No  Constitutional Fever: No Night sweats?: No Weight loss?: No Fatigue?: Yes  Skin Skin rash/lesions?: No Itching?: No  Eyes Blurred vision?: No Double vision?: No  Ears/Nose/Throat Sore throat?: No Sinus problems?: No  Hematologic/Lymphatic Swollen glands?: No Easy bruising?: No  Cardiovascular Leg swelling?: No Chest pain?: No   Respiratory Cough?: No Shortness of breath?: No  Endocrine Excessive thirst?: Yes  Musculoskeletal Back pain?: No Joint pain?: No  Neurological Headaches?: No Dizziness?: No  Psychologic Depression?: Yes Anxiety?: Yes  Physical Exam: Ht 5' (1.524 m)   BMI 38.47 kg/m   Constitutional:  Alert and oriented, No acute distress.  Laboratory Data: Lab Results  Component Value Date   WBC 7.0 04/13/2018   HGB 13.5 04/13/2018   HCT 39.3 04/13/2018   MCV 88.5 04/13/2018   PLT 184 04/13/2018    Lab Results  Component Value Date   CREATININE 0.67 04/13/2018    No results found for: PSA  No results found for: TESTOSTERONE  No results found for: HGBA1C  Urinalysis    Component Value Date/Time   COLORURINE YELLOW (A) 04/13/2018 1148   APPEARANCEUR CLOUDY (A) 04/13/2018 1148   LABSPEC 1.020 04/13/2018 1148   PHURINE 5.0 04/13/2018 1148   GLUCOSEU NEGATIVE 04/13/2018 1148   HGBUR NEGATIVE 04/13/2018 1148   [place] 04/13/2018 1148   KETONESUR 5 (A) 04/13/2018 1148   PROTEINUR NEGATIVE 04/13/2018 1148   NITRITE NEGATIVE 04/13/2018 1148   LEUKOCYTESUR SMALL (A) 04/13/2018 1148    Pertinent Imaging:   Assessment & Plan: Patient has mild mixed incontinence and both components seem to be modest.  She has medical comorbidities and she might have a little bit of bedwetting.  I gave her a prescription of oxybutynin ER 10 mg and Detrol LA 4 mg.  When she returns if she does not reach her goal a urethral injectable is an option and I do not think InterStim is appropriate if she is not wearing pads.  Botox is another option  When I asked her again she still does not wear a pad and she said the most bothersome complaint is resting to the restroom and leaking before she gets there.  We will discuss about Botox next time pending therapeutic outcome  There are no diagnoses linked to this encounter.  Return in about 8 weeks (around 08/18/2019).  Reece Packer, MD  [facility] [address], [place], [place] [address] [phone]

## 2019-07-03 ENCOUNTER — Telehealth: Payer: Self-pay

## 2019-07-03 DIAGNOSIS — N3941 Urge incontinence: Secondary | ICD-10-CM

## 2019-07-03 DIAGNOSIS — N3946 Mixed incontinence: Secondary | ICD-10-CM

## 2019-07-03 NOTE — Telephone Encounter (Signed)
Prior authorization for Tolterodine is denied. The patient must try Oxybutynin ER or Solifenacin. Pt currently already taking Oxybutynin she was to take Tolterodine in addition too, please advise.   Reference [id number]

## 2019-07-04 NOTE — Telephone Encounter (Signed)
VESIcare 5 mg 30 tablets and 11 refills

## 2019-07-07 MED ORDER — SOLIFENACIN SUCCINATE 5 MG PO TABS: 5.0000 mg | ORAL_TABLET | Freq: Every day | ORAL | 11 refills | Status: DC

## 2019-07-07 NOTE — Telephone Encounter (Signed)
RX sent in

## 2019-07-09 ENCOUNTER — Other Ambulatory Visit: Payer: Self-pay | Admitting: Family Medicine

## 2019-07-09 DIAGNOSIS — Z1231 Encounter for screening mammogram for malignant neoplasm of breast: Secondary | ICD-10-CM

## 2019-07-30 ENCOUNTER — Other Ambulatory Visit: Payer: Self-pay

## 2019-07-30 ENCOUNTER — Ambulatory Visit
Admission: RE | Admit: 2019-07-30 | Discharge: 2019-07-30 | Disposition: A | Discharge status: Home / Self Care | Payer: Medicare Other | Source: Ambulatory Visit | Attending: Family Medicine | Admitting: Family Medicine

## 2019-07-30 DIAGNOSIS — Z1231 Encounter for screening mammogram for malignant neoplasm of breast: Secondary | ICD-10-CM | POA: Diagnosis not present

## 2019-08-07 ENCOUNTER — Inpatient Hospital Stay
Admission: RE | Admit: 2019-08-07 | Discharge: 2019-08-07 | Disposition: A | Discharge status: Home / Self Care | Payer: Self-pay | Source: Ambulatory Visit | Attending: Family Medicine | Admitting: Family Medicine

## 2019-08-07 ENCOUNTER — Other Ambulatory Visit: Payer: Self-pay | Admitting: Family Medicine

## 2019-08-07 DIAGNOSIS — Z1231 Encounter for screening mammogram for malignant neoplasm of breast: Secondary | ICD-10-CM

## 2019-08-18 ENCOUNTER — Ambulatory Visit: Payer: Medicare Other | Admitting: Urology

## 2019-08-18 ENCOUNTER — Other Ambulatory Visit: Payer: Self-pay

## 2019-08-18 ENCOUNTER — Encounter: Payer: Self-pay | Admitting: Emergency Medicine

## 2019-08-18 ENCOUNTER — Ambulatory Visit
Admission: EM | Admit: 2019-08-18 | Discharge: 2019-08-18 | Disposition: A | Discharge status: Home / Self Care | Payer: Medicare Other | Attending: Family Medicine | Admitting: Family Medicine

## 2019-08-18 DIAGNOSIS — Z1159 Encounter for screening for other viral diseases: Secondary | ICD-10-CM | POA: Diagnosis not present

## 2019-08-18 DIAGNOSIS — B349 Viral infection, unspecified: Secondary | ICD-10-CM

## 2019-08-18 LAB — RAPID STREP SCREEN (MED CTR MEBANE ONLY): Streptococcus, Group A Screen (Direct): NEGATIVE

## 2019-08-18 NOTE — ED Triage Notes (Signed)
Patient here today c/o fatigue ,sorethroat ,bodyache ,Headache since last night around 7:30pm Received Flu shot last Tuesday  [id number]

## 2019-08-18 NOTE — ED Provider Notes (Signed)
[facility]    CSN: [id number] Arrival date & time: 08/18/19  1413      History   Chief Complaint No chief complaint on file.   HPI Taylor Thompson is a 63 y.o. female.   63 yo female with a c/o fatigue, sore throat, body aches, since last night. Denies any fevers, chills, chest pains, shortness of breath.      Past Medical History:  Diagnosis Date  . Acid reflux   . Alopecia 2012  . Anxiety   . Arthritis   . Depression   . Diabetes mellitus without complication ([place])   . Diverticulitis   . Diverticulosis   . Fatty liver   . Hyperlipidemia   . Hypertension   . Hypothyroidism   . IBS (irritable bowel syndrome)   . Panic disorder   . Seizures ([place]) 2009  . Skin cancer   . Sleep apnea   . Thyroid disease   . TIA (transient ischemic attack)     Patient Active Problem List   Diagnosis Date Noted  . Sleep apnea 04/13/2018  . Dyslipidemia 04/13/2018  . Diabetes ([place]) 04/13/2018  . UTI (urinary tract infection) 04/13/2018  . Major depressive disorder, recurrent, severe without psychotic features ([place]) 06/28/2015  . PTSD (post-traumatic stress disorder) 06/28/2015  . Grief 06/28/2015    Past Surgical History:  Procedure Laterality Date  . ABDOMINAL HYSTERECTOMY  1986   Vaginal  . ABDOMINAL HYSTERECTOMY    . CHOLECYSTECTOMY  2009  . COLONOSCOPY WITH PROPOFOL N/A 05/24/2018   Procedure: COLONOSCOPY WITH PROPOFOL;  Surgeon: Lollie Sails, MD;  Location: [facility] ENDOSCOPY;  Service: Endoscopy;  Laterality: N/A;  . ESOPHAGOGASTRODUODENOSCOPY (EGD) WITH PROPOFOL N/A 05/26/2019   Procedure: ESOPHAGOGASTRODUODENOSCOPY (EGD) WITH PROPOFOL;  Surgeon: Lollie Sails, MD;  Location: [facility] ENDOSCOPY;  Service: Endoscopy;  Laterality: N/A;  . SQUAMOUS CELL CARCINOMA EXCISION  2014    OB History   No obstetric history on file.      Home Medications    Prior to Admission medications   Medication Sig Start Date End Date Taking? Authorizing  Provider  acyclovir cream (ZOVIRAX) 5 % Apply topically.   Yes [provider]  aspirin EC 81 MG tablet Take 81 mg by mouth daily.   Yes [provider]  atorvastatin (LIPITOR) 80 MG tablet Take 80 mg by mouth daily. Pt taking differently. Takes 160mg  daily   Yes [provider]  azelastine (ASTELIN) 0.1 % nasal spray Place into the nose.   Yes [provider]  cetirizine (ZYRTEC) 10 MG tablet Take 10 mg by mouth daily. 10/31/18  Yes [provider]  citalopram (CELEXA) 40 MG tablet Take 40 mg by mouth daily. 06/05/19  Yes [provider]  clotrimazole-betamethasone (LOTRISONE) cream Apply 1 application topically 2 (two) times daily.   Yes [provider]  DULoxetine (CYMBALTA) 60 MG capsule Take 60 mg by mouth daily. 04/12/18  Yes [provider]  INSULIN ASPART PROT & ASPART [place] Inject into the skin 2 (two) times daily with a meal. 70/25 unit/ml   Yes [provider]  levothyroxine (SYNTHROID, LEVOTHROID) 25 MCG tablet Take 25 mcg by mouth daily before breakfast.   Yes [provider]  lisinopril (PRINIVIL,ZESTRIL) 5 MG tablet  01/02/19  Yes [provider]  metFORMIN (GLUCOPHAGE) 1000 MG tablet Take 2,000 mg by mouth daily.   Yes [provider]  traZODone (DESYREL) 100 MG tablet Take 200 mg by mouth at bedtime.  03/30/15  Yes [provider]  clonazePAM [place]) 0.5 MG tablet  04/18/15   [provider]  estradiol (ESTRACE) 0.1 MG/GM vaginal cream Apply 0.5 mg with fingertip to just inside the vaginal introitus on Monday, Wednesday and Friday nights 04/12/18   Zara Council A, PA-C  famciclovir (FAMVIR) 500 MG tablet Take 500 mg by mouth 3 (three) times daily. As needed for outbreak for duration of 5 days    [provider]  fluticasone (FLONASE) 50 MCG/ACT nasal spray Place into the nose.    [provider]  JARDIANCE 25 MG TABS tablet Take 25 mg by mouth  daily. 10/09/18   [provider]  methocarbamol (ROBAXIN) 500 MG tablet  02/17/15   [provider]  oxybutynin (DITROPAN-XL) 10 MG 24 hr tablet Take 1 tablet (10 mg total) by mouth daily. 06/23/19   Bjorn Loser, MD  pantoprazole (PROTONIX) 40 MG tablet Take 40 mg by mouth daily.    [provider]  solifenacin (VESICARE) 5 MG tablet Take 1 tablet (5 mg total) by mouth daily. 07/07/19   Bjorn Loser, MD  tolterodine (DETROL LA) 4 MG 24 hr capsule Take 1 capsule (4 mg total) by mouth daily. 06/23/19   Bjorn Loser, MD    Family History Family History  Problem Relation Age of Onset  . Dementia Mother   . CAD Mother   . Diabetes Mother   . Hypertension Mother   . Osteoarthritis Mother   . Cancer Father   . Cirrhosis Father   . Kidney cancer Neg Hx   . Kidney disease Neg Hx   . Prostate cancer Neg Hx   . Breast cancer Neg Hx     Social History Social History   Tobacco Use  . Smoking status: Never Smoker  . Smokeless tobacco: Never Used  Substance Use Topics  . Alcohol use: Yes    Alcohol/week: 0.0 standard drinks    Comment: rarely  . Drug use: No     Allergies   Codeine, Sulfa antibiotics, Dilaudid [hydromorphone hcl], and Poison ivy extract   Review of Systems Review of Systems   Physical Exam Triage Vital Signs ED Triage Vitals  Enc Vitals Group     BP 08/18/19 1444 136/89     Pulse Rate 08/18/19 1444 84     Resp --      Temp 08/18/19 1444 98.5 F (36.9 C)     Temp Source 08/18/19 1444 Oral     SpO2 08/18/19 1444 98 %     Weight 08/18/19 1437 193 lb (87.5 kg)     Height 08/18/19 1437 5' (1.524 m)     Head Circumference --      Peak Flow --      Pain Score 08/18/19 1436 4     Pain Loc --      Pain Edu? --      Excl. in [place]? --    No data found.  Updated Vital Signs BP 136/89 (BP Location: Left Arm)   Pulse 84   Temp 98.5 F (36.9 C) (Oral)   Ht 5' (1.524 m)   Wt 87.5 kg   SpO2 98%   BMI 37.69 kg/m    Visual Acuity Right Eye Distance:   Left Eye Distance:   Bilateral Distance:    Right Eye Near:   Left Eye Near:    Bilateral Near:     Physical Exam Vitals signs and nursing note reviewed.  Constitutional:  General: She is not in acute distress.    Appearance: She is not toxic-appearing or diaphoretic.  HENT:     Mouth/Throat:     Pharynx: Posterior oropharyngeal erythema present. No oropharyngeal exudate.  Cardiovascular:     Rate and Rhythm: Normal rate and regular rhythm.     Heart sounds: Normal heart sounds.  Pulmonary:     Effort: Pulmonary effort is normal. No respiratory distress.     Breath sounds: No stridor. No wheezing, rhonchi or rales.  Neurological:     Mental Status: She is alert.      UC Treatments / Results  Labs (all labs ordered are listed, but only abnormal results are displayed) Labs Reviewed  RAPID STREP SCREEN ([facility] ONLY)  NOVEL CORONAVIRUS, NAA (HOSP ORDER, SEND-OUT TO REF LAB; TAT 18-24 HRS)  CULTURE, GROUP A STREP [facility])    EKG   Radiology No results found.  Procedures Procedures (including critical care time)  Medications Ordered in UC Medications - No data to display  Initial Impression / Assessment and Plan / UC Course  I have reviewed the triage vital signs and the nursing notes.  Pertinent labs & imaging results that were available during my care of the patient were reviewed by me and considered in my medical decision making (see chart for details).     Final Clinical Impressions(s) / UC Diagnoses   Final diagnoses:  Viral syndrome     Discharge Instructions     Rest, fluids, over the counter medications as needed    ED Prescriptions    None      1. Lab result (rapid strep negative) and diagnosis reviewed with patient 2. rx as per orders above; reviewed possible side effects, interactions, risks and benefits  3. Recommend supportive treatment as above  4. Follow-up prn if symptoms worsen or  don't improve   PDMP not reviewed this encounter.   Norval Gable, MD 08/18/19 [phone]

## 2019-08-18 NOTE — Discharge Instructions (Signed)
Rest, fluids, over the counter medications as needed  

## 2019-08-19 LAB — NOVEL CORONAVIRUS, NAA (HOSP ORDER, SEND-OUT TO REF LAB; TAT 18-24 HRS): SARS-CoV-2, NAA: NOT DETECTED

## 2019-08-21 LAB — CULTURE, GROUP A STREP (THRC)

## 2019-11-04 IMAGING — CR DG HIP (WITH OR WITHOUT PELVIS) 2-3V*L*
3 series · 3 of 3 positions shown · non-contrast
Comparison: None.

CLINICAL DATA: Fall

EXAM:
DG HIP (WITH OR WITHOUT PELVIS) 2-3V LEFT

[pelvis ap]
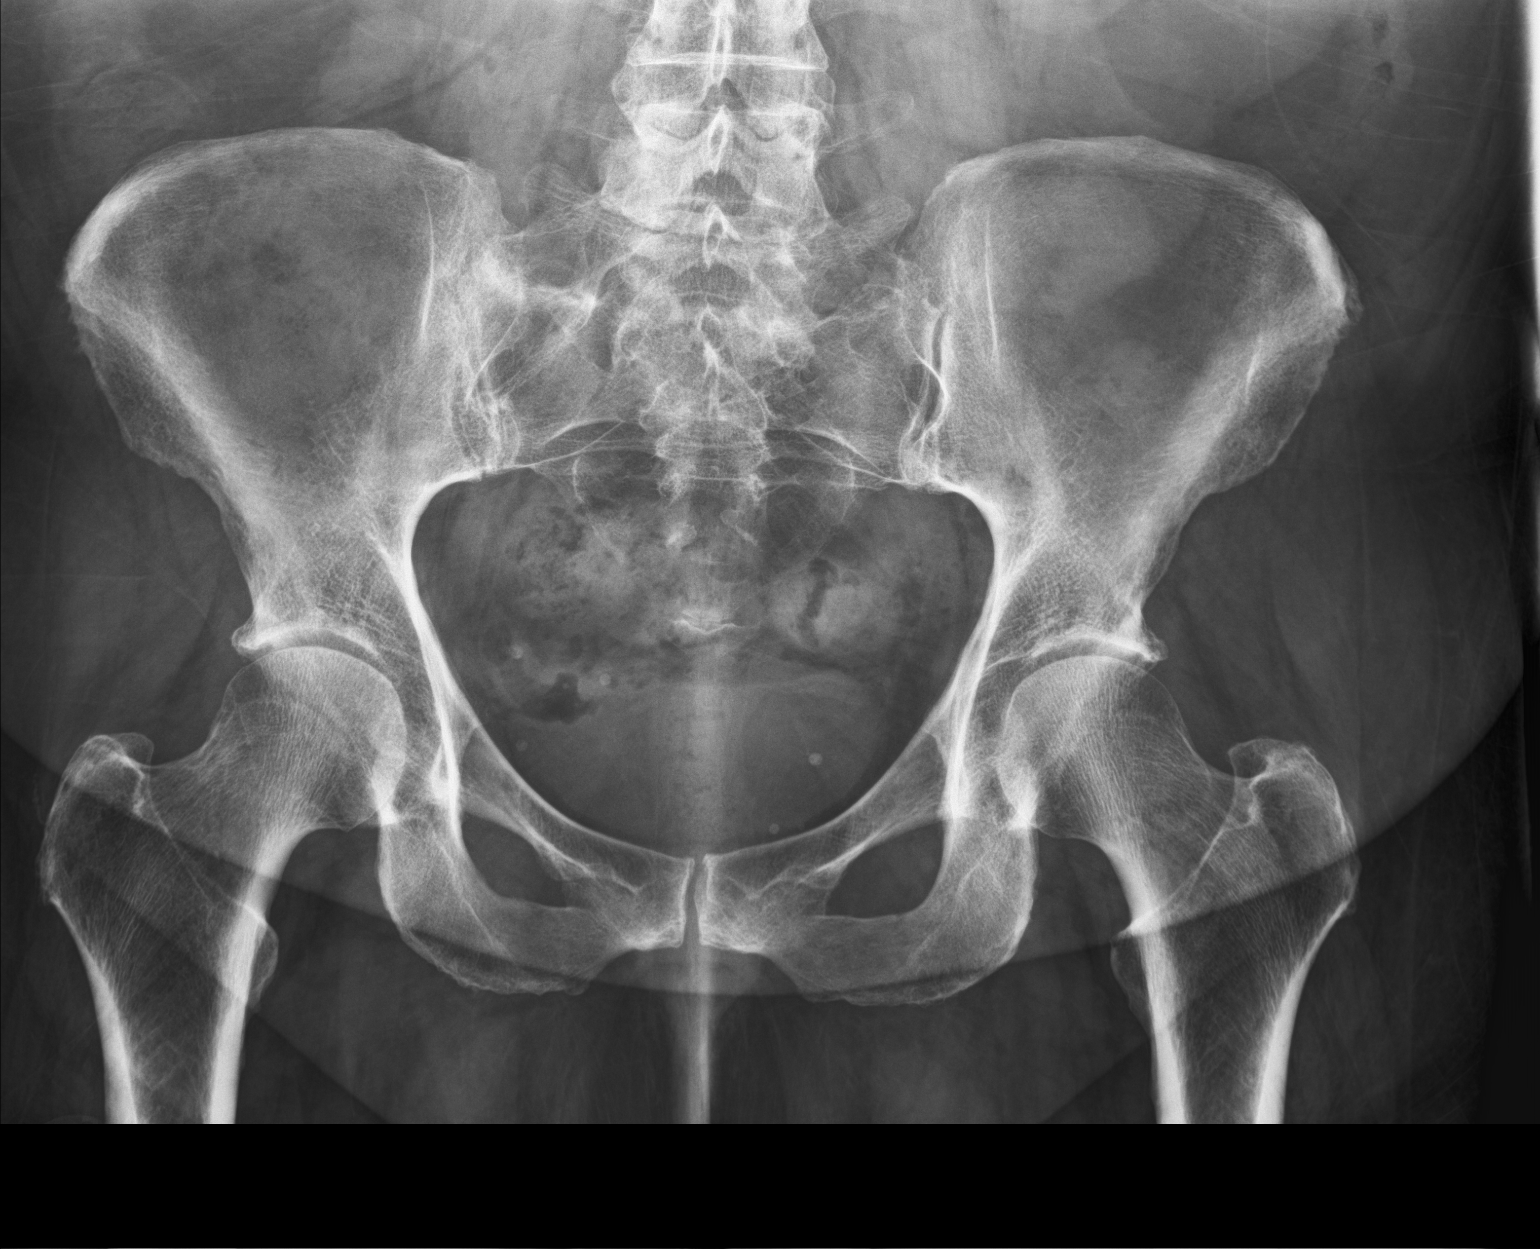

[hip ap]
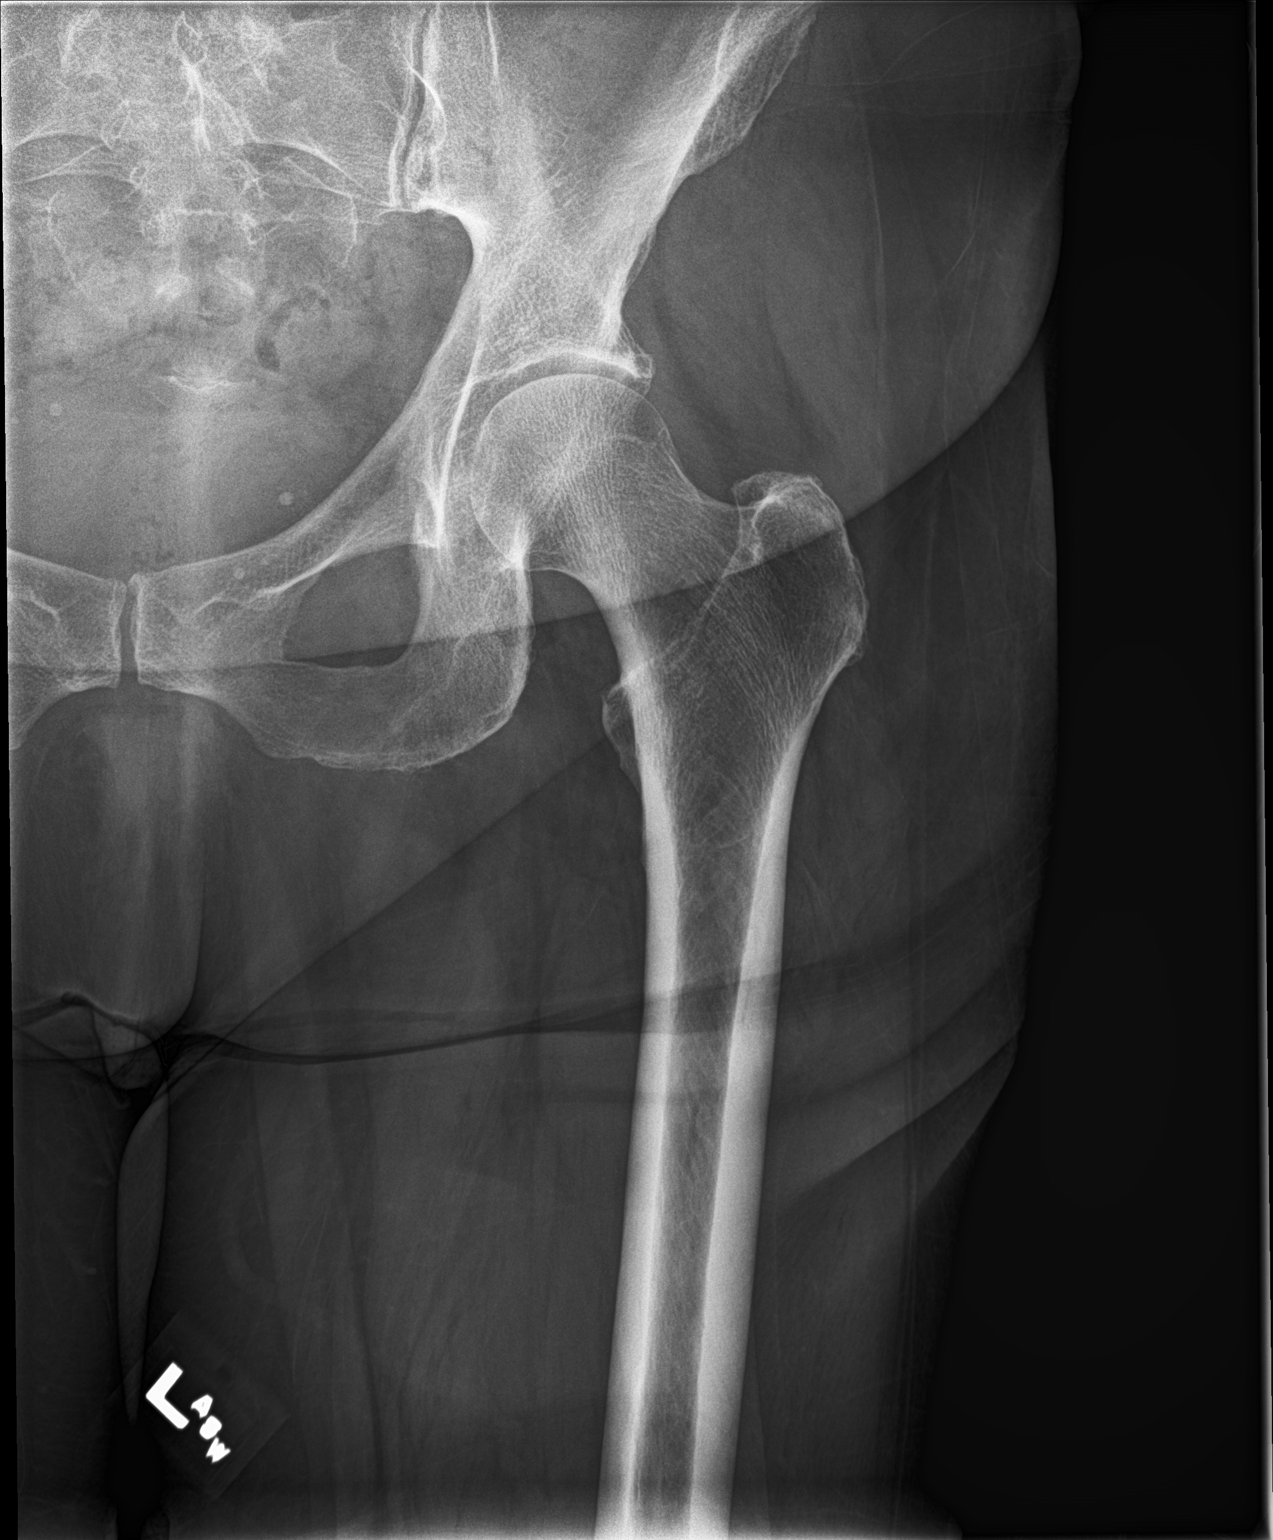

[hip lat]
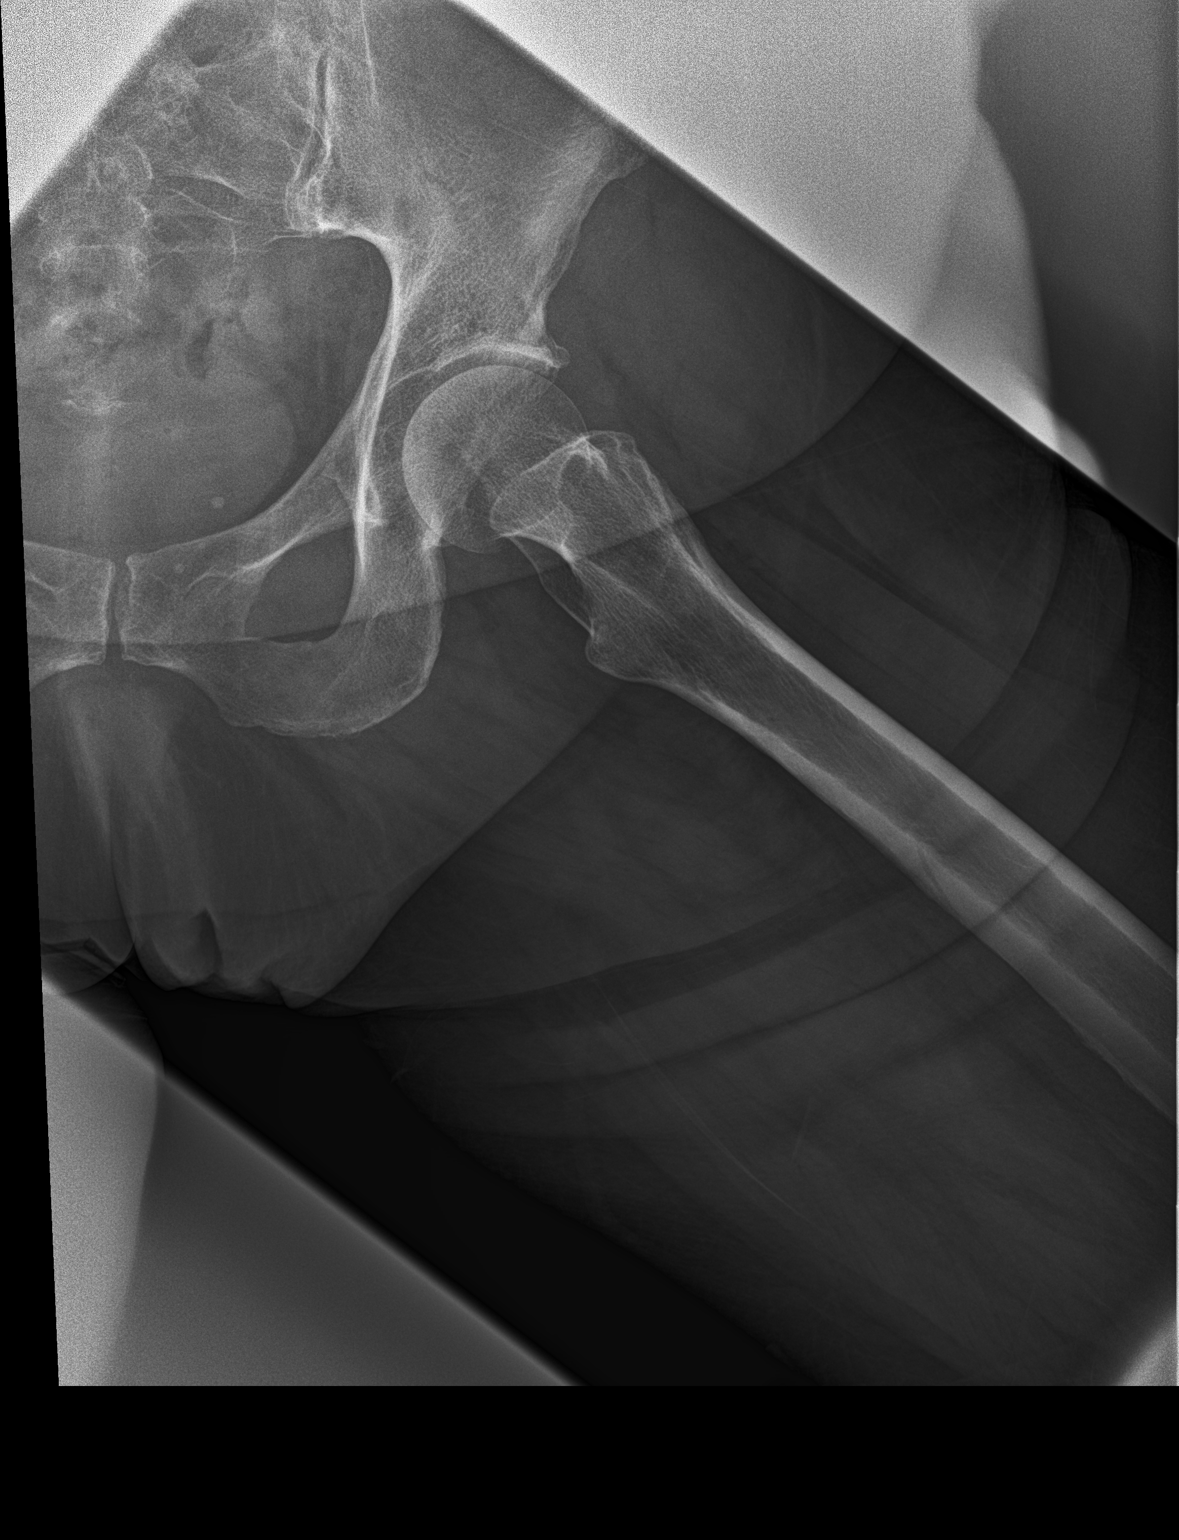

[3 of 3 positions shown; findings below may reference images not displayed]

FINDINGS: There is no evidence of hip fracture or dislocation. There is mild
bilateral acetabular osteophytosis.
IMPRESSION: No fracture or dislocation of the left hip.

## 2019-11-04 IMAGING — CR DG RIBS W/ CHEST 3+V*L*
5 series · 5 of 5 positions shown · non-contrast
Comparison: None.

CLINICAL DATA: Status post fall, pain

EXAM:
LEFT RIBS AND CHEST - 3+ VIEW

[chest pa]
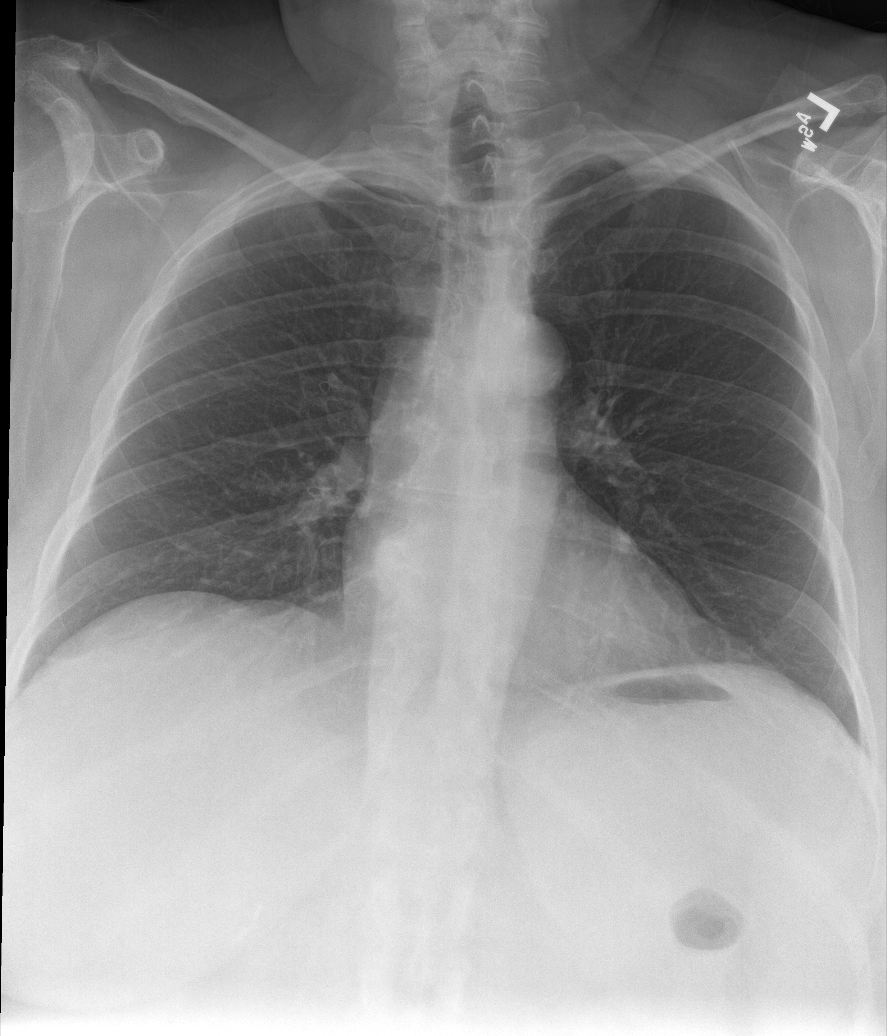

[rib pa (1 of 2)]
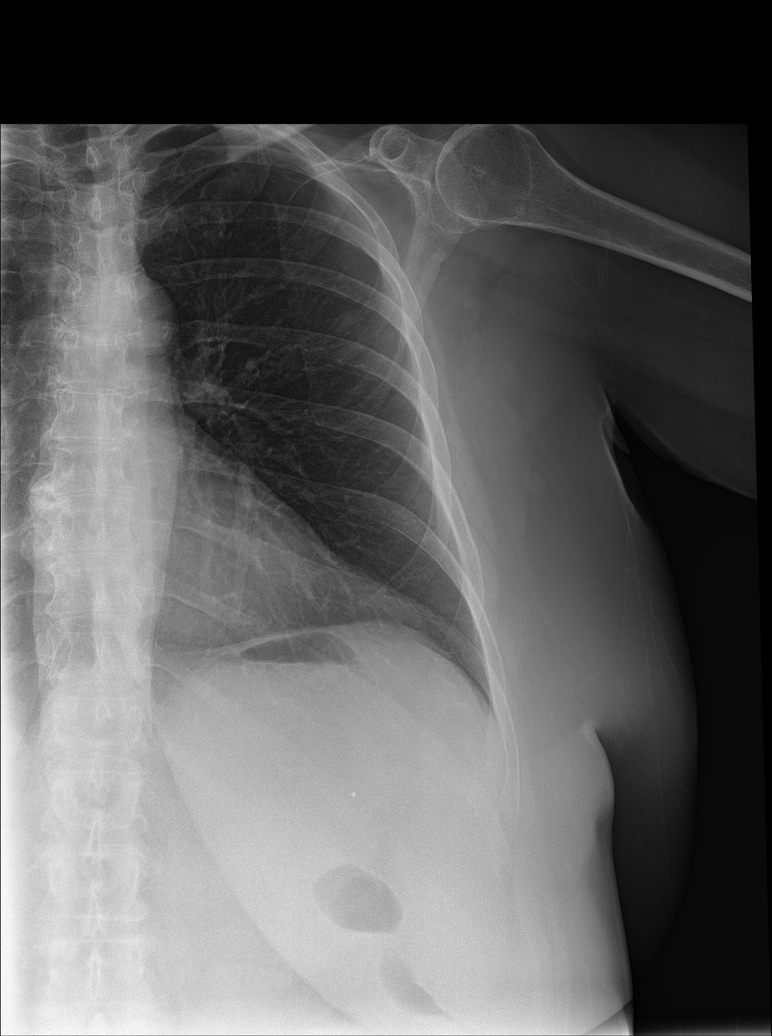

[rib obl (1 of 2)]
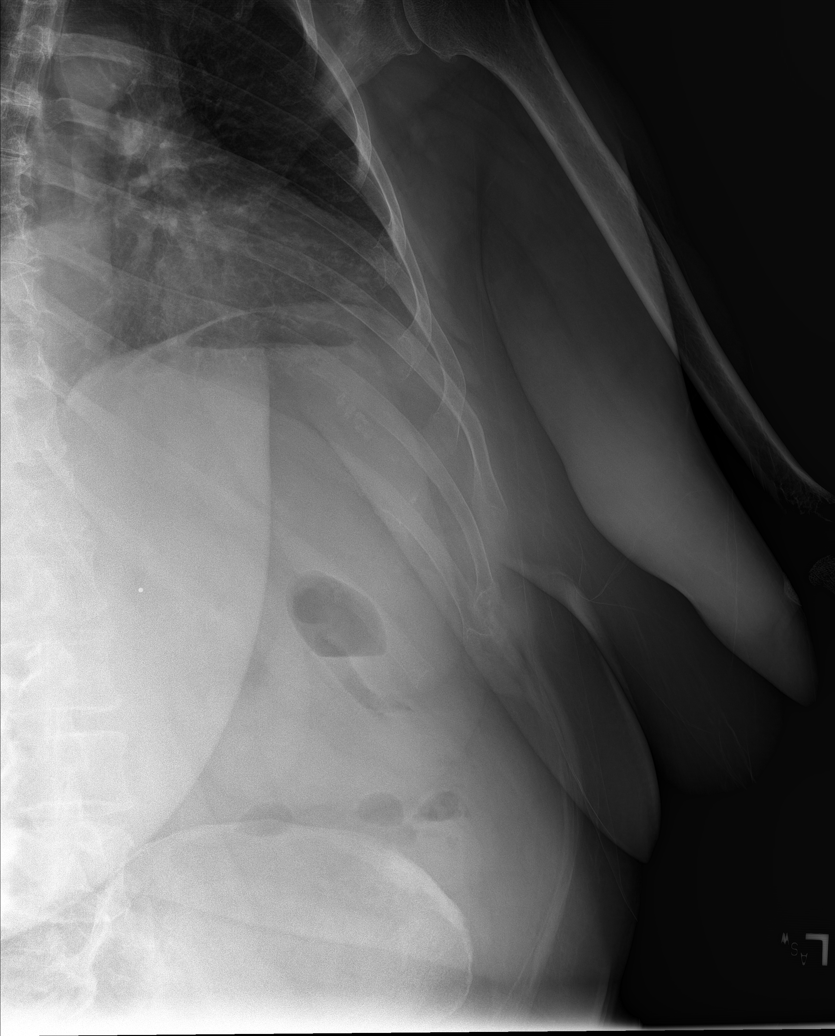

[rib obl (2 of 2)]
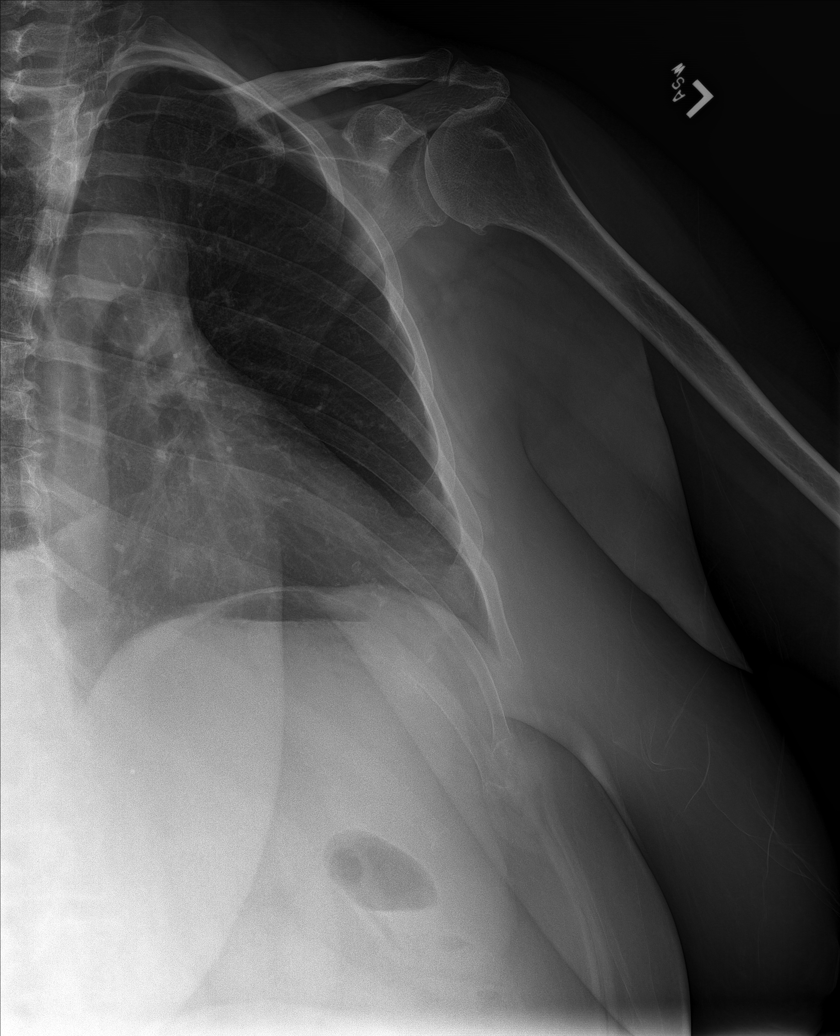

[rib pa (2 of 2)]
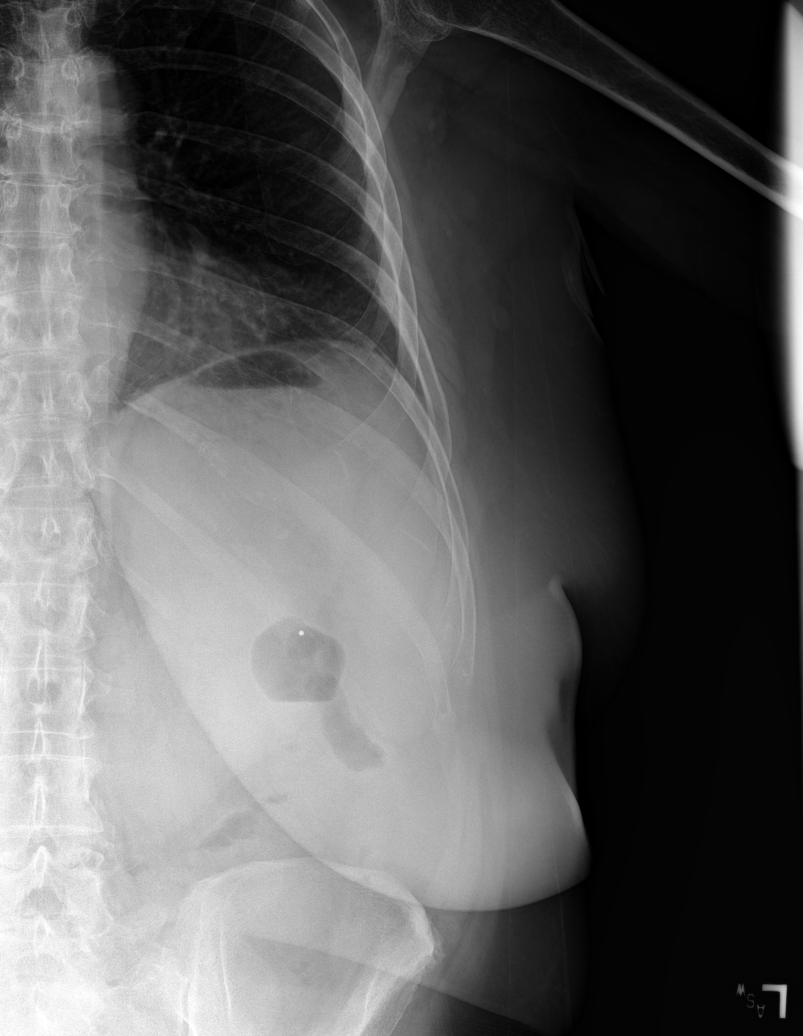

[5 of 5 positions shown; findings below may reference images not displayed]

FINDINGS: No fracture or other bone lesions are seen involving the ribs. There
is no evidence of pneumothorax or pleural effusion. Both lungs are
clear. Heart size and mediastinal contours are within normal limits.
IMPRESSION: Negative.

## 2020-09-01 ENCOUNTER — Other Ambulatory Visit: Payer: Self-pay | Admitting: Gastroenterology

## 2020-09-01 DIAGNOSIS — R1011 Right upper quadrant pain: Secondary | ICD-10-CM

## 2020-09-07 ENCOUNTER — Ambulatory Visit
Admission: RE | Admit: 2020-09-07 | Discharge: 2020-09-07 | Disposition: A | Discharge status: Home / Self Care | Payer: Medicare Other | Source: Ambulatory Visit | Attending: Gastroenterology | Admitting: Gastroenterology

## 2020-09-07 ENCOUNTER — Other Ambulatory Visit: Payer: Self-pay

## 2020-09-07 DIAGNOSIS — R1011 Right upper quadrant pain: Secondary | ICD-10-CM | POA: Insufficient documentation

## 2020-10-13 ENCOUNTER — Other Ambulatory Visit: Admission: RE | Admit: 2020-10-13 | Payer: Medicare Other | Source: Ambulatory Visit

## 2020-10-15 ENCOUNTER — Encounter: Admission: RE | Payer: Self-pay | Source: Home / Self Care

## 2020-10-15 ENCOUNTER — Ambulatory Visit: Admission: RE | Admit: 2020-10-15 | Payer: Medicare Other | Source: Home / Self Care

## 2020-10-15 SURGERY — ESOPHAGOGASTRODUODENOSCOPY (EGD) WITH PROPOFOL
Anesthesia: General

## 2021-03-07 ENCOUNTER — Encounter: Payer: Medicare Other | Attending: Family Medicine | Admitting: *Deleted

## 2021-03-07 ENCOUNTER — Encounter: Payer: Self-pay | Admitting: *Deleted

## 2021-03-07 ENCOUNTER — Other Ambulatory Visit: Payer: Self-pay

## 2021-03-07 VITALS — Ht 61.0 in | Wt 197.9 lb

## 2021-03-07 DIAGNOSIS — E1165 Type 2 diabetes mellitus with hyperglycemia: Secondary | ICD-10-CM | POA: Insufficient documentation

## 2021-03-07 NOTE — Progress Notes (Signed)
Diabetes Self-Management Education  Visit Type: First/Initial  Appt. Start Time: 1315 Appt. End Time: 1440  03/07/2021  [name], identified by name and date of birth, is a 65 y.o. female with a diagnosis of Diabetes: Type 2.   ASSESSMENT  Height 5\' 1"  (1.549 m), weight 197 lb 14.4 oz (89.8 kg). Body mass index is 37.39 kg/m.   Diabetes Self-Management Education - 03/07/21 1456      Visit Information   Visit Type First/Initial      Initial Visit   Diabetes Type Type 2    Are you currently following a meal plan? No    Are you taking your medications as prescribed? Yes    Date Diagnosed 2007      Health Coping   How would you rate your overall health? Fair      Psychosocial Assessment   Patient Belief/Attitude about Diabetes Other (comment)   "terrible"   Self-care barriers None    Self-management support Doctor's office;Friends;Family    Patient Concerns Nutrition/Meal planning;Medication;Monitoring;Healthy Lifestyle;Problem Solving;Glycemic Control;Weight Control;Support   "better control of depression and anxiety"   Special Needs None    Preferred Learning Style Auditory;Visual;Hands on    Learning Readiness Ready    How often do you need to have someone help you when you read instructions, pamphlets, or other written materials from your doctor or pharmacy? 1 - Never    What is the last grade level you completed in school? 12th      Pre-Education Assessment   Patient understands the diabetes disease and treatment process. Needs Review    Patient understands incorporating nutritional management into lifestyle. Needs Instruction    Patient undertands incorporating physical activity into lifestyle. Needs Instruction    Patient understands using medications safely. Needs Review    Patient understands monitoring blood glucose, interpreting and using results Needs Review    Patient understands prevention, detection, and treatment of acute complications. Needs  Review    Patient understands prevention, detection, and treatment of chronic complications. Needs Review    Patient understands how to develop strategies to address psychosocial issues. Needs Review    Patient understands how to develop strategies to promote health/change behavior. Needs Instruction      Complications   Last HgB A1C per patient/outside source 9.6 %   02/23/2021   How often do you check your blood sugar? 3-4 times/day    Fasting Blood glucose range (mg/dL) 130-179;>200   FBG's 137-332 mg/dL   Postprandial Blood glucose range (mg/dL) --   pre-lunch 179-357 mg/dL; pre-supper 92-341 mg/dL; bedtime 129-420 mg/dL   Have you had a dilated eye exam in the past 12 months? Yes    Have you had a dental exam in the past 12 months? Yes    Are you checking your feet? No      Dietary Intake   Breakfast skips or has cereal and milk; 2 eggs and biscuit; bagel occasional with cream cheese    Lunch chicken sandwich; beef burrito; nachos bel grande    Patent examiner, frozen dinners, peas beans, corn, salads with spinach, carrots, cuccumbers, tomatoes; zucchini, squash, broccoli, cauliflower; quarter pounder with fries and soda    Snack (evening) crackers, sometimes chocolate kisses    Beverage(s) water, 1/2 sweet and 1/2 unsweet tea, soda occasionally, diet soda      Exercise   Exercise Type ADL's      Patient Education   Previous Diabetes Education Yes (please comment)   year ago with [facility]  Disease state  Explored patient's options for treatment of their diabetes    Nutrition management  Role of diet in the treatment of diabetes and the relationship between the three main macronutrients and blood glucose level;Food label reading, portion sizes and measuring food.;Carbohydrate counting;Reviewed blood glucose goals for pre and post meals and how to evaluate the patients' food intake on their blood glucose level.;Meal timing in regards to the patients' current diabetes medication.    Physical  activity and exercise  Role of exercise on diabetes management, blood pressure control and cardiac health.    Medications Taught/reviewed insulin injection, site rotation, insulin storage and needle disposal.;Reviewed patients medication for diabetes, action, purpose, timing of dose and side effects.    Monitoring Purpose and frequency of SMBG.;Taught/discussed recording of test results and interpretation of SMBG.;Identified appropriate SMBG and/or A1C goals.    Acute complications Taught treatment of hypoglycemia - the 15 rule.    Chronic complications Relationship between chronic complications and blood glucose control    Psychosocial adjustment Identified and addressed patients feelings and concerns about diabetes    Personal strategies to promote health --   Pt reports depression much better in the last 3 weeks.     Individualized Goals (developed by patient)   Reducing Risk Other (comment)   improve blood sugars, decrease medications, prevent diabetes complications, lose weight, lead a healthier lifestyle, become more fit, better control of depression and anxiety     Outcomes   Expected Outcomes Demonstrated interest in learning. Expect positive outcomes    Future DMSE 2 wks           Individualized Plan for Diabetes Self-Management Training:   Learning Objective:  Patient will have a greater understanding of diabetes self-management. Patient education plan is to attend individual and/or group sessions per assessed needs and concerns.   Plan:   Patient Instructions  Check blood sugars 4 x day before each meal and before bed every day Bring blood sugar records to the next appointment  Exercise: Walk as tolerated   Eat 3 meals day,  1-2  snacks a day Space meals 4-6 hours apart Don't skip meals Avoid sugar sweetened drinks (soda, tea)  Complete 3 Day Food Record and bring to next appt  Carry fast acting glucose and a snack at all times Rotate injection sites  Return for  appointment on:  Monday  Mar 28, 2021 at 1:30 pm with Freda Munro (nurse)   Expected Outcomes:  Demonstrated interest in learning. Expect positive outcomes  Education material provided:  General Meal Planning Guidelines Simple Meal Plan 3 Day Food Record Symptoms, causes and treatments of Hypoglycemia Pen Site Selection  Exercise handout (ADA)  If problems or questions, patient to contact team via:  Johny Drilling, RN, [place] [phone]  Future DSME appointment: 2 wks  Mar 28, 2021 with the nurse

## 2021-03-07 NOTE — Patient Instructions (Signed)
Check blood sugars 4 x day before each meal and before bed every day Bring blood sugar records to the next appointment  Exercise: Walk as tolerated   Eat 3 meals day,  1-2  snacks a day Space meals 4-6 hours apart Don't skip meals Avoid sugar sweetened drinks (soda, tea)  Complete 3 Day Food Record and bring to next appt  Carry fast acting glucose and a snack at all times Rotate injection sites  Return for appointment on:  Monday  Mar 28, 2021 at 1:30 pm with Freda Munro (nurse)

## 2021-03-08 ENCOUNTER — Other Ambulatory Visit: Payer: Self-pay

## 2021-03-08 DIAGNOSIS — Z1231 Encounter for screening mammogram for malignant neoplasm of breast: Secondary | ICD-10-CM

## 2021-03-15 ENCOUNTER — Other Ambulatory Visit: Payer: Self-pay

## 2021-03-15 ENCOUNTER — Ambulatory Visit
Admission: RE | Admit: 2021-03-15 | Discharge: 2021-03-15 | Disposition: A | Discharge status: Home / Self Care | Payer: Medicare Other | Source: Ambulatory Visit

## 2021-03-15 DIAGNOSIS — Z1231 Encounter for screening mammogram for malignant neoplasm of breast: Secondary | ICD-10-CM

## 2021-03-28 ENCOUNTER — Ambulatory Visit: Payer: Medicare Other | Admitting: *Deleted

## 2021-04-08 ENCOUNTER — Ambulatory Visit
Admission: RE | Admit: 2021-04-08 | Discharge: 2021-04-08 | Disposition: A | Discharge status: Home / Self Care | Payer: Medicare Other | Attending: Physician Assistant | Admitting: Physician Assistant

## 2021-04-08 ENCOUNTER — Other Ambulatory Visit: Payer: Self-pay

## 2021-04-08 ENCOUNTER — Other Ambulatory Visit: Payer: Self-pay | Admitting: Physician Assistant

## 2021-04-08 ENCOUNTER — Ambulatory Visit
Admission: RE | Admit: 2021-04-08 | Discharge: 2021-04-08 | Disposition: A | Discharge status: Home / Self Care | Payer: Medicare Other | Source: Ambulatory Visit | Attending: Physician Assistant | Admitting: Physician Assistant

## 2021-04-08 DIAGNOSIS — M5451 Vertebrogenic low back pain: Secondary | ICD-10-CM | POA: Insufficient documentation

## 2021-04-08 DIAGNOSIS — M546 Pain in thoracic spine: Secondary | ICD-10-CM | POA: Diagnosis present

## 2021-04-12 ENCOUNTER — Encounter: Payer: Medicare Other | Attending: Family Medicine | Admitting: *Deleted

## 2021-04-12 ENCOUNTER — Other Ambulatory Visit: Payer: Self-pay

## 2021-04-12 ENCOUNTER — Encounter: Payer: Self-pay | Admitting: *Deleted

## 2021-04-12 VITALS — BP 140/84 | Wt 193.3 lb

## 2021-04-12 DIAGNOSIS — Z794 Long term (current) use of insulin: Secondary | ICD-10-CM | POA: Diagnosis present

## 2021-04-12 DIAGNOSIS — E1165 Type 2 diabetes mellitus with hyperglycemia: Secondary | ICD-10-CM | POA: Insufficient documentation

## 2021-04-12 NOTE — Patient Instructions (Signed)
Check blood sugars 4 x day before each meal and before bed every day Bring blood sugar records to MD appointment  Walk as tolerated  Eat 3 meals day,1-2 snacks a day Space meals 4-6 hours apart Don't skip meals - eat at least 1 protein and 1 carbohydrate serving  Continue to avoid sugar sweetened drinks   Carry fast acting glucose and a snack at all times Rotate injection sites  Call for any questions

## 2021-04-12 NOTE — Progress Notes (Signed)
Diabetes Self-Management Education  Visit Type: Follow-up  Appt. Start Time: 1330 Appt. End Time: 1440  04/12/2021  [name], identified by name and date of birth, is a 65 y.o. female with a diagnosis of Diabetes: Type 2.   ASSESSMENT  Blood pressure 140/84, weight 193 lb 4.8 oz (87.7 kg). Body mass index is 36.52 kg/m.   Diabetes Self-Management Education - 04/12/21 1450      Visit Information   Visit Type Follow-up      Initial Visit   Diabetes Type Type 2      Complications   How often do you check your blood sugar? 3-4 times/day    Fasting Blood glucose range (mg/dL) 70-129;130-179;180-200;>200   FBG's range from 104-307 mg/dL   Postprandial Blood glucose range (mg/dL) --   before lunch 89-312 mg/dL; before supper 76-221 mg/dL; bedtime 96-286 mg/dL   Number of hypoglycemic episodes per month 1    Can you tell when your blood sugar is low? Yes    What do you do if your blood sugar is low? drank juice; carries glucose tablets    Have you had a dilated eye exam in the past 12 months? Yes    Have you had a dental exam in the past 12 months? Yes    Are you checking your feet? No      Dietary Intake   Breakfast only eating 1 meal and 1 snack/day      Exercise   Exercise Type ADL's      Patient Education   Disease state  Explored patient's options for treatment of their diabetes    Nutrition management  Role of diet in the treatment of diabetes and the relationship between the three main macronutrients and blood glucose level;Food label reading, portion sizes and measuring food.;Carbohydrate counting;Reviewed blood glucose goals for pre and post meals and how to evaluate the patients' food intake on their blood glucose level.;Meal timing in regards to the patients' current diabetes medication.    Physical activity and exercise  Role of exercise on diabetes management, blood pressure control and cardiac health.    Medications Taught/reviewed insulin injection, site  rotation, insulin storage and needle disposal.;Reviewed patients medication for diabetes, action, purpose, timing of dose and side effects.;Other (comment)   Pt giving injections to her stomach now.   Monitoring Purpose and frequency of SMBG.;Taught/discussed recording of test results and interpretation of SMBG.;Identified appropriate SMBG and/or A1C goals.;Daily foot exams    Acute complications Taught treatment of hypoglycemia - the 15 rule.    Chronic complications Relationship between chronic complications and blood glucose control    Psychosocial adjustment Identified and addressed patients feelings and concerns about diabetes      Individualized Goals (developed by patient)   Nutrition Follow meal plan discussed    Monitoring  test my blood glucose as discussed    Reducing Risk do foot checks daily;treat hypoglycemia with 15 grams of carbs if blood glucose less than 70mg /dL      Post-Education Assessment   Patient understands the diabetes disease and treatment process. Demonstrates understanding / competency    Patient understands incorporating nutritional management into lifestyle. Needs Review    Patient undertands incorporating physical activity into lifestyle. Needs Review    Patient understands using medications safely. Demonstrates understanding / competency    Patient understands monitoring blood glucose, interpreting and using results Demonstrates understanding / competency    Patient understands prevention, detection, and treatment of acute complications. Demonstrates understanding / competency  Patient understands prevention, detection, and treatment of chronic complications. Needs Review    Patient understands how to develop strategies to address psychosocial issues. Demonstrates understanding / competency    Patient understands how to develop strategies to promote health/change behavior. Demonstrates understanding / competency      Outcomes   Expected Outcomes Demonstrated  interest in learning. Expect positive outcomes    Future DMSE PRN    Program Status Completed      Subsequent Visit   Since your last visit have you continued or begun to take your medications as prescribed? Yes    Since your last visit have you had your blood pressure checked? Yes    Is your most recent blood pressure lower, unchanged, or higher since your last visit? Higher    Since your last visit have you experienced any weight changes? Loss    Weight Loss (lbs) 4.6    Since your last visit, are you checking your blood glucose at least once a day? Yes           Individualized Plan for Diabetes Self-Management Training:   Learning Objective:  Patient will have a greater understanding of diabetes self-management. Patient education plan is to attend individual and/or group sessions per assessed needs and concerns.   Plan:   Patient Instructions  Check blood sugars 4 x day before each meal and before bed every day Bring blood sugar records to MD appointment  Walk as tolerated  Eat 3 meals day,1-2 snacks a day Space meals 4-6 hours apart Don't skip meals - eat at least 1 protein and 1 carbohydrate serving  Continue to avoid sugar sweetened drinks   Carry fast acting glucose and a snack at all times Rotate injection sites  Call for any questions   Expected Outcomes:  Demonstrated interest in learning. Expect positive outcomes  Education material provided:  Planning A Balanced Meal  If problems or questions, patient to contact team via:   Johny Drilling, RN, CCM, [place] [phone]  Future DSME appointment: PRN

## 2022-01-30 ENCOUNTER — Other Ambulatory Visit: Payer: Self-pay | Admitting: Family Medicine

## 2022-01-30 DIAGNOSIS — Z1231 Encounter for screening mammogram for malignant neoplasm of breast: Secondary | ICD-10-CM

## 2022-02-13 ENCOUNTER — Other Ambulatory Visit: Payer: Self-pay | Admitting: Gastroenterology

## 2022-02-13 ENCOUNTER — Other Ambulatory Visit (HOSPITAL_COMMUNITY): Payer: Self-pay | Admitting: Gastroenterology

## 2022-02-13 DIAGNOSIS — K760 Fatty (change of) liver, not elsewhere classified: Secondary | ICD-10-CM

## 2022-02-28 ENCOUNTER — Ambulatory Visit: Payer: Medicare Other

## 2022-03-01 ENCOUNTER — Ambulatory Visit
Admission: RE | Admit: 2022-03-01 | Discharge: 2022-03-01 | Disposition: A | Discharge status: Home / Self Care | Payer: Medicare Other | Source: Ambulatory Visit | Attending: Gastroenterology | Admitting: Gastroenterology

## 2022-03-01 DIAGNOSIS — K760 Fatty (change of) liver, not elsewhere classified: Secondary | ICD-10-CM | POA: Insufficient documentation

## 2022-03-16 ENCOUNTER — Ambulatory Visit
Admission: RE | Admit: 2022-03-16 | Discharge: 2022-03-16 | Disposition: A | Discharge status: Home / Self Care | Payer: Medicare Other | Source: Ambulatory Visit | Attending: Family Medicine | Admitting: Family Medicine

## 2022-03-16 DIAGNOSIS — Z1231 Encounter for screening mammogram for malignant neoplasm of breast: Secondary | ICD-10-CM | POA: Diagnosis not present

## 2022-03-28 ENCOUNTER — Other Ambulatory Visit: Payer: Self-pay

## 2022-03-28 ENCOUNTER — Emergency Department
Admission: EM | Admit: 2022-03-28 | Discharge: 2022-03-28 | Disposition: A | Discharge status: Home / Self Care | Payer: Medicare Other | Attending: Emergency Medicine | Admitting: Emergency Medicine

## 2022-03-28 DIAGNOSIS — K922 Gastrointestinal hemorrhage, unspecified: Secondary | ICD-10-CM | POA: Insufficient documentation

## 2022-03-28 DIAGNOSIS — R1011 Right upper quadrant pain: Secondary | ICD-10-CM | POA: Diagnosis present

## 2022-03-28 DIAGNOSIS — E039 Hypothyroidism, unspecified: Secondary | ICD-10-CM | POA: Insufficient documentation

## 2022-03-28 DIAGNOSIS — I10 Essential (primary) hypertension: Secondary | ICD-10-CM | POA: Diagnosis not present

## 2022-03-28 DIAGNOSIS — Z85820 Personal history of malignant melanoma of skin: Secondary | ICD-10-CM | POA: Diagnosis not present

## 2022-03-28 DIAGNOSIS — E119 Type 2 diabetes mellitus without complications: Secondary | ICD-10-CM | POA: Insufficient documentation

## 2022-03-28 DIAGNOSIS — M549 Dorsalgia, unspecified: Secondary | ICD-10-CM | POA: Insufficient documentation

## 2022-03-28 LAB — URINALYSIS, ROUTINE W REFLEX MICROSCOPIC
Bilirubin Urine: NEGATIVE
Glucose, UA: NEGATIVE mg/dL
Hgb urine dipstick: NEGATIVE
Ketones, ur: NEGATIVE mg/dL
Nitrite: NEGATIVE
Protein, ur: NEGATIVE mg/dL
Specific Gravity, Urine: 1.01 (ref 1.005–1.030)
pH: 6 (ref 5.0–8.0)

## 2022-03-28 LAB — COMPREHENSIVE METABOLIC PANEL
ALT: 23 U/L (ref 0–44)
AST: 22 U/L (ref 15–41)
Albumin: 3.9 g/dL (ref 3.5–5.0)
Alkaline Phosphatase: 81 U/L (ref 38–126)
Anion gap: 10 (ref 5–15)
BUN: 8 mg/dL (ref 8–23)
CO2: 25 mmol/L (ref 22–32)
Calcium: 9.6 mg/dL (ref 8.9–10.3)
Chloride: 104 mmol/L (ref 98–111)
Creatinine, Ser: 0.77 mg/dL (ref 0.44–1.00)
GFR, Estimated: >60 mL/min (ref >60)
Glucose, Bld: 124 mg/dL — ABNORMAL HIGH (ref 70–99)
Potassium: 3.5 mmol/L (ref 3.5–5.1)
Sodium: 139 mmol/L (ref 135–145)
Total Bilirubin: 0.6 mg/dL (ref 0.3–1.2)
Total Protein: 7.1 g/dL (ref 6.5–8.1)

## 2022-03-28 LAB — CBC WITH DIFFERENTIAL/PLATELET
Abs Immature Granulocytes: 0.03 10*3/uL (ref 0.00–0.07)
Basophils Absolute: 0.1 10*3/uL (ref 0.0–0.1)
Basophils Relative: 1 %
Eosinophils Absolute: 0.2 10*3/uL (ref 0.0–0.5)
Eosinophils Relative: 2 %
HCT: 39.7 % (ref 36.0–46.0)
Hemoglobin: 13.2 g/dL (ref 12.0–15.0)
Immature Granulocytes: 0 %
Lymphocytes Relative: 35 %
Lymphs Abs: 3.1 10*3/uL (ref 0.7–4.0)
MCH: 29.9 pg (ref 26.0–34.0)
MCHC: 33.2 g/dL (ref 30.0–36.0)
MCV: 89.8 fL (ref 80.0–100.0)
Monocytes Absolute: 0.9 10*3/uL (ref 0.1–1.0)
Monocytes Relative: 10 %
Neutro Abs: 4.7 10*3/uL (ref 1.7–7.7)
Neutrophils Relative %: 52 %
Platelets: 228 10*3/uL (ref 150–400)
RBC: 4.42 MIL/uL (ref 3.87–5.11)
RDW: 12.4 % (ref 11.5–15.5)
WBC: 9.1 10*3/uL (ref 4.0–10.5)
nRBC: 0 % (ref 0.0–0.2)

## 2022-03-28 LAB — TYPE AND SCREEN
ABO/RH(D): O POS
Antibody Screen: NEGATIVE

## 2022-03-28 LAB — LIPASE, BLOOD: Lipase: 31 U/L (ref 11–51)

## 2022-03-28 MED ORDER — ALUM & MAG HYDROXIDE-SIMETH 200-200-20 MG/5ML PO SUSP
30.0000 mL | Freq: Once | ORAL | Status: AC
  Administered 2022-03-28: 30 mL via ORAL
  Filled 2022-03-28: qty 30

## 2022-03-28 MED ORDER — SUCRALFATE 1 G PO TABS
1.0000 g | ORAL_TABLET | Freq: Once | ORAL | Status: AC
  Administered 2022-03-28: 1 g via ORAL
  Filled 2022-03-28: qty 1

## 2022-03-28 NOTE — ED Provider Notes (Signed)
? ?[facility] ?Provider Note ? ? ? Event Date/Time  ? First MD Initiated Contact with Patient 03/28/22 1756   ?  (approximate) ? ? ?History  ? ?Rectal Bleeding ? ? ?HPI ? ?Taylor Thompson is a 66 y.o. female with a past medical history of a TIA, OSA, seizures, IBS, hypothyroidism, HTN, HDL, fatty liver disease, diverticulosis, DM, depression anxiety and arthritis who presents for evaluation of some ongoing pain in her right upper quadrant and 3 to 4 days of mucousy stools with orange to reddish tinge to them.  She states her right upper quadrant abdominal pain feels stabbing in nature and has been ongoing for about a week.  States she went to her regular doctor on 5/11 and had a CT done that was totally normal and was referred to GI and has an appointment for early June.  She feels that the pain in her abdomen is not any different than usual but that over the last 2 or 3 days she noticed the mucousy stools that were reddish tinge.  She denies any urinary symptoms, headache earache, sore throat, fevers, chest pain, rash or extremity pain.  She states she has had some intermittent back pains as well.  It is not well localized on history or exam.  She denies any regular recent NSAID use or EtOH use.  She does not recall any episodes of GI bleeding before.  States that she had a polyp seen on colonoscopy in the past.  No recent antibiotics or travel outside [place]. ? ?  ?Past Medical History:  ?Diagnosis Date  ? Acid reflux   ? Alopecia 2012  ? Anxiety   ? Arthritis   ? Depression   ? Diabetes mellitus without complication ([place])   ? Diverticulitis   ? Diverticulosis   ? Fatty liver   ? Hyperlipidemia   ? Hypertension   ? Hypothyroidism   ? IBS (irritable bowel syndrome)   ? Panic disorder   ? Seizures ([place]) 2009  ? Skin cancer   ? Sleep apnea   ? Thyroid disease   ? TIA (transient ischemic attack)   ? ? ? ?Physical Exam  ?Triage Vital Signs: ?ED Triage Vitals  ?Enc Vitals Group  ?    BP 03/28/22 1703 138/81  ?   Pulse Rate 03/28/22 1703 87  ?   Resp 03/28/22 1703 17  ?   Temp 03/28/22 1703 98.9 ?F (37.2 ?C)  ?   Temp Source 03/28/22 1703 Oral  ?   SpO2 03/28/22 1703 94 %  ?   Weight --   ?   Height --   ?   Head Circumference --   ?   Peak Flow --   ?   Pain Score 03/28/22 1704 3  ?   Pain Loc --   ?   Pain Edu? --   ?   Excl. in [place]? --   ? ? ?Most recent vital signs: ?Vitals:  ? 03/28/22 1703 03/28/22 1703  ?BP:  138/81  ?Pulse:  87  ?Resp:  17  ?Temp: 98.9 ?F (37.2 ?C) 98.9 ?F (37.2 ?C)  ?SpO2:  94%  ? ? ?General: Awake, no distress.  ?CV:  Good peripheral perfusion.  2+ radial pulses. ?Resp:  Normal effort.  Clear bilaterally. ?Abd:  No distention.  Some mild tenderness in the right upper quadrant.  No significant lower abdominal tenderness rigidity or guarding. ?Other:  Is unremarkable and there is no CVA tenderness. ? ?  Rectal exam is unremarkable.  Stool is Hemoccult negative. ? ? ?ED Results / Procedures / Treatments  ?Labs ?(all labs ordered are listed, but only abnormal results are displayed) ?Labs Reviewed  ?COMPREHENSIVE METABOLIC PANEL - Abnormal; Notable for the following components:  ?    Result Value  ? Glucose, Bld 124 (*)   ? All other components within normal limits  ?URINALYSIS, ROUTINE W REFLEX MICROSCOPIC - Abnormal; Notable for the following components:  ? Color, Urine YELLOW (*)   ? APPearance CLEAR (*)   ? Leukocytes,Ua LARGE (*)   ? Bacteria, UA RARE (*)   ? Non Squamous Epithelial PRESENT (*)   ? All other components within normal limits  ?GASTROINTESTINAL PANEL BY PCR, STOOL (REPLACES STOOL CULTURE)  ?C DIFFICILE QUICK SCREEN W PCR REFLEX    ?URINE CULTURE  ?CBC WITH DIFFERENTIAL/PLATELET  ?LIPASE, BLOOD  ?H. PYLORI ANTIGEN, STOOL  ?TYPE AND SCREEN  ? ? ? ?EKG ? ? ?RADIOLOGY ? ? ? ?PROCEDURES: ? ?Critical Care performed: No ? ?Procedures ? ? ?MEDICATIONS ORDERED IN ED: ?Medications  ?sucralfate (CARAFATE) tablet 1 g (1 g Oral Given 03/28/22 1828)  ?alum & mag  hydroxide-simeth (MAALOX/MYLANTA) 200-200-20 MG/5ML suspension 30 mL (30 mLs Oral Given 03/28/22 1828)  ? ? ? ?IMPRESSION / MDM / ASSESSMENT AND PLAN / ED COURSE  ?I reviewed the triage vital signs and the nursing notes. ?             ?               ? ?Differential diagnosis includes, but is not limited to is related to patient's fatty liver disease, hepatitis, biliary obstruction, pancreatitis, gastritis, peptic ulcer disease and colitis. ?  ?Patient's pain has not changed at all in nature since she had her CT and she otherwise very well-appearing with minimal right upper quadrant tenderness on exam and stable vitals with no leukocytosis and I have a very low suspicion for any severe infectious process.  She does not appear septic or toxic and have a very low suspicion for developing abdominal abscess or perforation. ? ?Lipase not suggestive of pancreatitis.  CMP without any significant lecture metabolic derangements.  There is no evidence of hepatitis or cholestatic process.  BC shows no leukocytosis or acute anemia.  UA has some large leukocyte esterase and 11-20 WBCs but there is also squamous epithelial cells and given patient has no urinary symptoms have a lower suspicion for clinically significant cystitis although we will send a urine culture. ? ?I reviewed patient's CT abdomen pelvis with contrast obtained on 5/11 that was read by radiology as showing no acute intra-abdominal or pelvic process to explain patient's right upper quadrant pain.  She was noted to have a tiny fat-containing ventral umbilical hernia but no other acute process. ? ?I suspect patient's fatty liver could certainly be contributing to her right upper quadrant pain given she is status post cholecystectomy and her symptoms have not changed at all in her upper quadrants and she last had a CT on the 11th.  I have low suspicion for interim development of perforation abscess or diverticulitis.  Her Hemoccult test is negative and she reports  that her stool is brownish-red and certainly possible this was related to infectious enteritis.  Attempted test and stool studies unfortunately stool study was lost in the lab.  Patient is understandably quite upset about this and is unwilling to stay to provide another sample.  Discussed that I strongly recommend she follow-up with  her GI doctor and that if she develops any new pain or change in the quality of her pain since she left her CT she will need to be read CT to see if she is developed any other acute process such as a diverticulitis or abscess.  Advised her to follow-up with her GI doctor.  I did attempt empathize with her frustrations with her stool studies getting lost.  Discharged in stable condition. ? ? ? ?FINAL CLINICAL IMPRESSION(S) / ED DIAGNOSES  ? ?Final diagnoses:  ?Gastrointestinal hemorrhage, unspecified gastrointestinal hemorrhage type  ? ? ? ?Rx / DC Orders  ? ?ED Discharge Orders   ? ? None  ? ?  ? ? ? ?Note:  This document was prepared using Dragon voice recognition software and may include unintentional dictation errors. ?  ?Lucrezia Starch, MD ?03/28/22 2108 ? ?

## 2022-03-28 NOTE — ED Notes (Signed)
Pt in [facility] lobby and being disruptive to staff in [place] regarding her care, loud and vulgar.  ?

## 2022-03-28 NOTE — ED Provider Triage Note (Signed)
Emergency Medicine Provider Triage Evaluation Note ? ?Elon Jester , a 66 y.o. female  was evaluated in triage.  Pt complains of 2 days of bloody diarrhea.  She has had several days of right upper quadrant abdominal pain and distention.  5 days ago had a negative CT of the abdomen and pelvis performed at [facility].  She denies any rectal pain, fevers, nausea or vomiting.  No chest pain shortness of breath dizziness or lightheadedness. ? ?Patient with history of cholecystectomy ? ?Review of Systems  ?Positive: Bloody diarrhea, right upper quadrant abdominal pain ?Negative: Chest pain, shortness of breath ? ?Physical Exam  ?BP 138/81   Pulse 87   Temp 98.9 ?F (37.2 ?C) (Oral)   Resp 17   SpO2 94%  ?Gen:   Awake, no distress presents in a wheelchair ?Resp:  Normal effort  ?MSK:   Moves extremities without difficulty  ?Other:   ? ?Medical Decision Making  ?Medically screening exam initiated at 5:06 PM.  Appropriate orders placed.  Elon Jester was informed that the remainder of the evaluation will be completed by another provider, this initial triage assessment does not replace that evaluation, and the importance of remaining in the ED until their evaluation is complete. ? ? ?  ?Duanne Guess, PA-C ?03/28/22 1707 ? ?

## 2022-03-28 NOTE — ED Notes (Signed)
Pt refused d/c vitals, after this RN reviewed her AVS with her, pt waved her hand in a gesture to push this RN out of her way, this RN step aside and pt through her AVS papers int he trash and left the department.  ?

## 2022-03-28 NOTE — ED Notes (Signed)
Advised Dr. Tamala Julian of pt's incident with stool sample, Gastroccult was competed, but her stool seems to be misplaced by pervious RN. Pt is very upset, advised pt that Dr. Tamala Julian would come talk to her as she is wanting to leave, at this time, pt bad mouthing this facility and the previous RN.  ?

## 2022-03-28 NOTE — ED Triage Notes (Signed)
Pt to Ed via [facility] from home. Pt has been having rectal bleeding since Sunday. Pt reports watery stool and states the 3rd time to the bathroom she noticed bight reddish/orange blood in stool. Pt also reports RUQ pain and back pain x1wk. PCP called EMS. Pt reports she takes daily stool softeners.  ? ?Pt hx fatty liver and gallbladder removed.  ? ?EMS VS ?BP 141/69 ?HR 100 ?

## 2022-03-28 NOTE — ED Notes (Signed)
Asked pt about a stool sample. Pt reports another RN took her sample when she did the Gastroccult. Called lab, they have no sample of stool for this pt.  ?

## 2022-03-29 LAB — URINE CULTURE: Culture: NO GROWTH

## 2022-05-26 ENCOUNTER — Ambulatory Visit
Admission: RE | Admit: 2022-05-26 | Discharge: 2022-05-26 | Disposition: A | Discharge status: Home / Self Care | Payer: Medicare Other | Source: Ambulatory Visit | Attending: Family Medicine | Admitting: Family Medicine

## 2022-05-26 ENCOUNTER — Ambulatory Visit
Admission: RE | Admit: 2022-05-26 | Discharge: 2022-05-26 | Disposition: A | Discharge status: Home / Self Care | Payer: Medicare Other | Attending: Family Medicine | Admitting: Family Medicine

## 2022-05-26 ENCOUNTER — Other Ambulatory Visit: Payer: Self-pay | Admitting: Family Medicine

## 2022-05-26 DIAGNOSIS — R053 Chronic cough: Secondary | ICD-10-CM

## 2022-06-26 ENCOUNTER — Other Ambulatory Visit: Payer: Self-pay | Admitting: Family Medicine

## 2022-06-26 ENCOUNTER — Ambulatory Visit
Admission: RE | Admit: 2022-06-26 | Discharge: 2022-06-26 | Disposition: A | Discharge status: Home / Self Care | Payer: Medicare Other | Source: Ambulatory Visit | Attending: Family Medicine | Admitting: Family Medicine

## 2022-06-26 ENCOUNTER — Ambulatory Visit
Admission: RE | Admit: 2022-06-26 | Discharge: 2022-06-26 | Disposition: A | Discharge status: Home / Self Care | Payer: Medicare Other | Attending: Family Medicine | Admitting: Family Medicine

## 2022-06-26 DIAGNOSIS — J22 Unspecified acute lower respiratory infection: Secondary | ICD-10-CM | POA: Insufficient documentation

## 2022-12-01 ENCOUNTER — Ambulatory Visit
Admission: EM | Admit: 2022-12-01 | Discharge: 2022-12-01 | Disposition: A | Discharge status: Home / Self Care | Payer: Medicare Other | Attending: Physician Assistant | Admitting: Physician Assistant

## 2022-12-01 ENCOUNTER — Ambulatory Visit (INDEPENDENT_AMBULATORY_CARE_PROVIDER_SITE_OTHER): Payer: Medicare Other

## 2022-12-01 DIAGNOSIS — S92354A Nondisplaced fracture of fifth metatarsal bone, right foot, initial encounter for closed fracture: Secondary | ICD-10-CM | POA: Diagnosis not present

## 2022-12-01 MED ORDER — TRAMADOL HCL 50 MG PO TABS: 50.0000 mg | ORAL_TABLET | Freq: Two times a day (BID) | ORAL | 0 refills | Status: AC | PRN

## 2022-12-01 NOTE — ED Triage Notes (Signed)
Pt c/o fall at 6pm last night,   Pt states that her leg gave out and she fell down 2 steps.  Pt is having pain and bruising along the lateral side of the right foot.   Pt states that the pain is right beneath the toes and goes down the foot.   Pt is having trouble walking

## 2022-12-01 NOTE — Discharge Instructions (Signed)
-  Fractured your fifth metatarsal.  We have given you a boot and crutches.  You should be nonweightbearing until you are cleared by orthopedics.  Contact with the office as below next week. - Wear the boot like a cast. - Ibuprofen or Aleve and Tylenol as needed for pain relief, ice, elevation, rest.  You have a condition requiring you to follow up with Orthopedics so please call one of the following office for appointment:   [facility] [address] [place], [place] [address] Phone: [phone]  [facility] [address] [place], [place] [address] Phone: [phone]

## 2022-12-01 NOTE — ED Provider Notes (Addendum)
[facility]    CSN: [id number] Arrival date & time: 12/01/22  1743      History   Chief Complaint Chief Complaint  Patient presents with   Fall    HPI Taylor Thompson is a 67 y.o. female presenting for pain and swelling of the right lateral foot since yesterday.  She reports that her leg gave out and she fell down a couple of steps.  She says this is not the first time that happened.  She says she has been unable to bear weight on the foot due to extreme pain.  She says the pain caused her to have sweats last night.  Denies any numbness.  Has had some bruising in the lateral foot.  No pain over ankle or other part of leg.  No other injury sustained.  Has tried Advil.  No other complaints.  HPI  Past Medical History:  Diagnosis Date   Acid reflux    Alopecia 2012   Anxiety    Arthritis    Depression    Diabetes mellitus without complication ([place])    Diverticulitis    Diverticulosis    Fatty liver    Hyperlipidemia    Hypertension    Hypothyroidism    IBS (irritable bowel syndrome)    Panic disorder    Seizures ([place]) 2009   Skin cancer    Sleep apnea    Thyroid disease    TIA (transient ischemic attack)     Patient Active Problem List   Diagnosis Date Noted   Sleep apnea 04/13/2018   Dyslipidemia 04/13/2018   Diabetes ([place]) 04/13/2018   UTI (urinary tract infection) 04/13/2018   Major depressive disorder, recurrent, severe without psychotic features ([place]) 06/28/2015   PTSD (post-traumatic stress disorder) 06/28/2015   Grief 06/28/2015    Past Surgical History:  Procedure Laterality Date   ABDOMINAL HYSTERECTOMY  1986   Vaginal   ABDOMINAL HYSTERECTOMY     CHOLECYSTECTOMY  2009   COLONOSCOPY WITH PROPOFOL N/A 05/24/2018   Procedure: COLONOSCOPY WITH PROPOFOL;  Surgeon: Lollie Sails, MD;  Location[facility] ENDOSCOPY;  Service: Endoscopy;  Laterality: N/A;   ESOPHAGOGASTRODUODENOSCOPY (EGD) WITH PROPOFOL N/A 05/26/2019   Procedure:  ESOPHAGOGASTRODUODENOSCOPY (EGD) WITH PROPOFOL;  Surgeon: Lollie Sails, MD;  Location[facility] ENDOSCOPY;  Service: Endoscopy;  Laterality: N/A;   SQUAMOUS CELL CARCINOMA EXCISION  2014    OB History   No obstetric history on file.      Home Medications    Prior to Admission medications   Medication Sig Start Date End Date Taking? Authorizing Provider  acyclovir (ZOVIRAX) 400 MG tablet Take 400 mg by mouth 2 (two) times daily. 12/13/20  Yes [provider]  acyclovir cream (ZOVIRAX) 5 % Apply topically.   Yes [provider]  aspirin EC 81 MG tablet Take 81 mg by mouth daily.   Yes [provider]  atorvastatin (LIPITOR) 80 MG tablet Take 80 mg by mouth daily. Pt taking differently. Takes '160mg'$  daily   Yes [provider]  azelastine (ASTELIN) 0.1 % nasal spray Place into the nose.   Yes [provider]  cetirizine (ZYRTEC) 10 MG tablet Take 10 mg by mouth daily. 10/31/18  Yes [provider]  Cholecalciferol 50 MCG (2000 UT) CAPS Take by mouth.   Yes [provider]  citalopram (CELEXA) 40 MG tablet Take 40 mg by mouth daily. 06/05/19  Yes [provider]  clonazePAM (KLONOPIN) 0.5 MG tablet Take 0.5 mg by  mouth at bedtime.   Yes [provider]  clotrimazole-betamethasone (LOTRISONE) cream Apply 1 application topically 2 (two) times daily.   Yes [provider]  diazepam (VALIUM) 5 MG tablet Take 5 mg by mouth at bedtime as needed. 02/04/21  Yes [provider]  DULoxetine (CYMBALTA) 60 MG capsule Take 60 mg by mouth daily. 04/12/18  Yes [provider]  famciclovir (FAMVIR) 250 MG tablet Take 250 mg by mouth as needed.   Yes [provider]  fluticasone (FLONASE) 50 MCG/ACT nasal spray Place into the nose.   Yes [provider]  insulin lispro (HUMALOG) 100 UNIT/ML KwikPen Inject 16 Units into the skin in the morning, at noon, and at bedtime. Plus sliding scale  03/03/21  Yes [provider]  LANTUS SOLOSTAR 100 UNIT/ML Solostar Pen Inject 60 Units into the skin daily. 02/04/21  Yes [provider]  levothyroxine (SYNTHROID) 25 MCG tablet Take 50 mcg by mouth daily.   Yes [provider]  lisinopril (ZESTRIL) 10 MG tablet Take 1 tablet by mouth daily. 02/04/21  Yes [provider]  lurasidone (LATUDA) 20 MG TABS tablet TAKE 1 TABLET BY MOUTH IN THE EVENING WITH SUPPER 09/08/22  Yes [provider]  metFORMIN (GLUCOPHAGE-XR) 750 MG 24 hr tablet Take 750 mg by mouth in the morning and at bedtime. 02/23/21  Yes [provider]  methocarbamol (ROBAXIN) 500 MG tablet Take 1 tablet by mouth 2 (two) times daily. 03/26/21  Yes [provider]  pantoprazole (PROTONIX) 40 MG tablet Take 40 mg by mouth daily.   Yes [provider]  topiramate (TOPAMAX) 25 MG tablet Take 50 mg by mouth at bedtime. 02/04/21  Yes [provider]  traZODone (DESYREL) 100 MG tablet Take 200 mg by mouth at bedtime.  03/30/15  Yes [provider]  lidocaine (LIDODERM) 5 % 1 patch daily. 03/26/21   [provider]    Family History Family History  Problem Relation Age of Onset   Dementia Mother    CAD Mother    Diabetes Mother    Hypertension Mother    Osteoarthritis Mother    Cancer Father    Cirrhosis Father    Kidney cancer Neg Hx    Kidney disease Neg Hx    Prostate cancer Neg Hx    Breast cancer Neg Hx     Social History Social History   Tobacco Use   Smoking status: Never   Smokeless tobacco: Never  Vaping Use   Vaping Use: Never used  Substance Use Topics   Alcohol use: Yes    Alcohol/week: 0.0 standard drinks of alcohol    Comment: rarely   Drug use: No     Allergies   Codeine, Sulfa antibiotics, Dilaudid [hydromorphone hcl], and Poison ivy extract   Review of Systems Review of Systems  Musculoskeletal:  Positive for arthralgias, gait problem and joint swelling.   Skin:  Positive for color change. Negative for wound.  Neurological:  Negative for weakness and numbness.     Physical Exam Triage Vital Signs ED Triage Vitals  Enc Vitals Group     BP 12/01/22 1756 120/70     Pulse Rate 12/01/22 1756 73     Resp 12/01/22 1756 18     Temp 12/01/22 1756 98.2 F (36.8 C)     Temp Source 12/01/22 1756 Oral     SpO2 12/01/22 1756 96 %     Weight 12/01/22 1753 202 lb (91.6 kg)  Height 12/01/22 1753 5' (1.524 m)     Head Circumference --      Peak Flow --      Pain Score 12/01/22 1750 10     Pain Loc --      Pain Edu? --      Excl. in [place]? --    No data found.  Updated Vital Signs BP 120/70 (BP Location: Left Arm)   Pulse 73   Temp 98.2 F (36.8 C) (Oral)   Resp 18   Ht 5' (1.524 m)   Wt 202 lb (91.6 kg)   SpO2 96%   BMI 39.45 kg/m    Physical Exam Vitals and nursing note reviewed.  Constitutional:      General: She is not in acute distress.    Appearance: Normal appearance. She is not ill-appearing or toxic-appearing.  HENT:     Head: Normocephalic and atraumatic.  Eyes:     General: No scleral icterus.       Right eye: No discharge.        Left eye: No discharge.     Conjunctiva/sclera: Conjunctivae normal.  Cardiovascular:     Rate and Rhythm: Normal rate and regular rhythm.     Heart sounds: Normal heart sounds.  Pulmonary:     Effort: Pulmonary effort is normal. No respiratory distress.     Breath sounds: Normal breath sounds.  Musculoskeletal:     Cervical back: Neck supple.     Right ankle: Normal.     Right Achilles Tendon: Normal.     Right foot: Normal capillary refill. Swelling (lateral foot with ecchymosis) and tenderness (4th and 5th metatarsals) present. Normal pulse.  Skin:    General: Skin is dry.  Neurological:     General: No focal deficit present.     Mental Status: She is alert. Mental status is at baseline.     Motor: No weakness.     Gait: Gait abnormal.  Psychiatric:        Mood and Affect:  Mood normal.        Behavior: Behavior normal.        Thought Content: Thought content normal.      UC Treatments / Results  Labs (all labs ordered are listed, but only abnormal results are displayed) Labs Reviewed - No data to display  EKG   Radiology DG Foot Complete Right  Result Date: 12/01/2022 CLINICAL DATA:  Pain and bruising along the lateral right foot after a fall last night. EXAM: RIGHT FOOT COMPLETE - 3+ VIEW COMPARISON:  None Available. FINDINGS: Oblique fracture of the midshaft right fifth metatarsal bone with minimal medial displacement of the distal fracture fragment. Overlying soft tissue swelling is present. No articular involvement. No other fractures identified. Joint spaces are normal. No radiopaque soft tissue foreign bodies or gas. IMPRESSION: Oblique fracture of the midshaft right fifth metatarsal. Electronically Signed   By: Lucienne Capers M.D.   On: 12/01/2022 18:10    Procedures Procedures (including critical care time)  Medications Ordered in UC Medications - No data to display  Initial Impression / Assessment and Plan / UC Course  I have reviewed the triage vital signs and the nursing notes.  Pertinent labs & imaging results that were available during my care of the patient were reviewed by me and considered in my medical decision making (see chart for details).   67 year old female presents for right foot pain, swelling and bruising after falling down a couple of steps yesterday.  X-ray obtained today shows a nondisplaced oblique fracture of the midshaft of the right fifth metatarsal.  Which is where patient has most tenderness and the swelling and ecchymosis.  Patient placed in cam boot and advised to use this as a cast.  Given crutches.  Reviewed nonweightbearing and follow-up with orthopedics.  Advised NSAIDs and Tylenol and reviewed RICE guidelines.  Sent a few tramadol because she needs it for severe pain.  Information given for her to contact  Ortho.   Final Clinical Impressions(s) / UC Diagnoses   Final diagnoses:  Closed nondisplaced fracture of fifth metatarsal bone of right foot, initial encounter     Discharge Instructions      -Fractured your fifth metatarsal.  We have given you a boot and crutches.  You should be nonweightbearing until you are cleared by orthopedics.  Contact with the office as below next week. - Wear the boot like a cast. - Ibuprofen or Aleve and Tylenol as needed for pain relief, ice, elevation, rest.  You have a condition requiring you to follow up with Orthopedics so please call one of the following office for appointment:   [facility] [address] [place], [place] [address] Phone: [phone]  [facility] [address] [place], [place] [address] Phone: [phone]    ED Prescriptions   None    PDMP not reviewed this encounter.   Danton Clap, PA-C 12/01/22 1842    Danton Clap, PA-C 12/01/22 [phone]

## 2023-02-02 ENCOUNTER — Other Ambulatory Visit: Payer: Self-pay | Admitting: Family Medicine

## 2023-02-02 DIAGNOSIS — Z1231 Encounter for screening mammogram for malignant neoplasm of breast: Secondary | ICD-10-CM

## 2023-03-19 ENCOUNTER — Ambulatory Visit: Payer: Medicare Other

## 2023-04-03 ENCOUNTER — Ambulatory Visit
Admission: RE | Admit: 2023-04-03 | Discharge: 2023-04-03 | Disposition: A | Discharge status: Home / Self Care | Payer: Medicare Other | Source: Ambulatory Visit | Attending: Family Medicine | Admitting: Family Medicine

## 2023-04-03 ENCOUNTER — Ambulatory Visit
Admission: RE | Admit: 2023-04-03 | Discharge: 2023-04-03 | Disposition: A | Discharge status: Home / Self Care | Payer: Medicare Other | Attending: Family Medicine | Admitting: Family Medicine

## 2023-04-03 ENCOUNTER — Other Ambulatory Visit: Payer: Self-pay | Admitting: Family Medicine

## 2023-04-03 DIAGNOSIS — M25562 Pain in left knee: Secondary | ICD-10-CM | POA: Insufficient documentation

## 2023-04-05 ENCOUNTER — Ambulatory Visit
Admission: RE | Admit: 2023-04-05 | Discharge: 2023-04-05 | Disposition: A | Discharge status: Home / Self Care | Payer: Medicare Other | Source: Ambulatory Visit | Attending: Family Medicine | Admitting: Family Medicine

## 2023-04-05 DIAGNOSIS — Z1231 Encounter for screening mammogram for malignant neoplasm of breast: Secondary | ICD-10-CM | POA: Diagnosis present

## 2023-04-25 ENCOUNTER — Other Ambulatory Visit: Payer: Self-pay | Admitting: Family Medicine

## 2023-04-25 ENCOUNTER — Ambulatory Visit
Admission: RE | Admit: 2023-04-25 | Discharge: 2023-04-25 | Disposition: A | Discharge status: Home / Self Care | Payer: Medicare Other | Attending: Family Medicine | Admitting: Family Medicine

## 2023-04-25 ENCOUNTER — Ambulatory Visit
Admission: RE | Admit: 2023-04-25 | Discharge: 2023-04-25 | Disposition: A | Discharge status: Home / Self Care | Payer: Medicare Other | Source: Ambulatory Visit | Attending: Family Medicine | Admitting: Family Medicine

## 2023-04-25 DIAGNOSIS — M79671 Pain in right foot: Secondary | ICD-10-CM

## 2023-11-29 ENCOUNTER — Encounter: Payer: Self-pay | Admitting: Gastroenterology

## 2024-03-06 ENCOUNTER — Ambulatory Visit: Admit: 2024-03-06 | Payer: Medicare Other | Admitting: Gastroenterology

## 2024-03-06 HISTORY — DX: Cerebral infarction, unspecified: I63.9

## 2024-03-06 SURGERY — COLONOSCOPY WITH PROPOFOL
Anesthesia: General

## 2024-04-14 ENCOUNTER — Other Ambulatory Visit: Payer: Self-pay | Admitting: Family Medicine

## 2024-04-14 DIAGNOSIS — Z1231 Encounter for screening mammogram for malignant neoplasm of breast: Secondary | ICD-10-CM

## 2024-05-22 ENCOUNTER — Ambulatory Visit
Admission: RE | Admit: 2024-05-22 | Discharge: 2024-05-22 | Disposition: A | Discharge status: Home / Self Care | Source: Ambulatory Visit | Attending: Family Medicine | Admitting: Family Medicine

## 2024-05-22 DIAGNOSIS — Z1231 Encounter for screening mammogram for malignant neoplasm of breast: Secondary | ICD-10-CM | POA: Diagnosis present

## 2024-06-20 ENCOUNTER — Other Ambulatory Visit: Payer: Self-pay | Admitting: Family Medicine

## 2024-06-20 DIAGNOSIS — R600 Localized edema: Secondary | ICD-10-CM

## 2024-06-26 ENCOUNTER — Ambulatory Visit

## 2024-09-01 ENCOUNTER — Encounter: Payer: Self-pay | Admitting: *Deleted

## 2024-09-02 ENCOUNTER — Ambulatory Visit
Admission: RE | Admit: 2024-09-02 | Discharge: 2024-09-02 | Disposition: A | Discharge status: Home / Self Care | Source: Ambulatory Visit | Attending: Family Medicine | Admitting: Family Medicine

## 2024-09-02 ENCOUNTER — Other Ambulatory Visit: Payer: Self-pay | Admitting: Family Medicine

## 2024-09-02 DIAGNOSIS — R600 Localized edema: Secondary | ICD-10-CM

## 2024-12-22 ENCOUNTER — Encounter (INDEPENDENT_AMBULATORY_CARE_PROVIDER_SITE_OTHER): Admitting: Vascular Surgery

## 2024-12-25 ENCOUNTER — Encounter (INDEPENDENT_AMBULATORY_CARE_PROVIDER_SITE_OTHER): Admitting: Vascular Surgery
